# Patient Record
Sex: Female | Born: 1960
Health system: Southern US, Community
[De-identification: ages and names within clinical notes are randomized; demographics above are authoritative.]

## PROBLEM LIST (undated history)

## (undated) DIAGNOSIS — I10 Essential (primary) hypertension: Secondary | ICD-10-CM

---

## 2008-05-27 ENCOUNTER — Ambulatory Visit: Payer: Self-pay | Admitting: Diagnostic Radiology

## 2008-05-28 ENCOUNTER — Inpatient Hospital Stay (HOSPITAL_COMMUNITY): Admission: EM | Admit: 2008-05-28 | Discharge: 2008-05-30 | Payer: Self-pay | Admitting: Emergency Medicine

## 2008-06-18 ENCOUNTER — Inpatient Hospital Stay (HOSPITAL_COMMUNITY): Admission: EM | Admit: 2008-06-18 | Discharge: 2008-06-21 | Payer: Self-pay | Admitting: Emergency Medicine

## 2010-03-31 ENCOUNTER — Encounter: Admission: RE | Admit: 2010-03-31 | Discharge: 2010-03-31 | Payer: Self-pay | Admitting: Orthopedic Surgery

## 2010-09-07 LAB — CBC
HCT: 31.2 % — ABNORMAL LOW (ref 36.0–46.0)
HCT: 41.7 % (ref 36.0–46.0)
HCT: 37.4 % (ref 36.0–46.0)
Hemoglobin: 10.1 g/dL — ABNORMAL LOW (ref 12.0–15.0)
Hemoglobin: 10.9 g/dL — ABNORMAL LOW (ref 12.0–15.0)
Hemoglobin: 13.3 g/dL (ref 12.0–15.0)
Hemoglobin: 9.9 g/dL — ABNORMAL LOW (ref 12.0–15.0)
Hemoglobin: 11.9 g/dL — ABNORMAL LOW (ref 12.0–15.0)
Hemoglobin: 13 g/dL (ref 12.0–15.0)
MCHC: 31.2 g/dL (ref 30.0–36.0)
MCHC: 31.8 g/dL (ref 30.0–36.0)
MCHC: 32.7 g/dL (ref 30.0–36.0)
MCHC: 31.8 g/dL (ref 30.0–36.0)
MCHC: 31.8 g/dL (ref 30.0–36.0)
MCV: 68.1 fL — ABNORMAL LOW (ref 78.0–100.0)
MCV: 68.6 fL — ABNORMAL LOW (ref 78.0–100.0)
MCV: 69.2 fL — ABNORMAL LOW (ref 78.0–100.0)
MCV: 69.6 fL — ABNORMAL LOW (ref 78.0–100.0)
MCV: 69.7 fL — ABNORMAL LOW (ref 78.0–100.0)
RBC: 4.57 MIL/uL (ref 3.87–5.11)
RBC: 5.05 MIL/uL (ref 3.87–5.11)
RBC: 6.07 MIL/uL — ABNORMAL HIGH (ref 3.87–5.11)
RBC: 5.38 MIL/uL — ABNORMAL HIGH (ref 3.87–5.11)
RBC: 5.88 MIL/uL — ABNORMAL HIGH (ref 3.87–5.11)
RDW: 17.7 % — ABNORMAL HIGH (ref 11.5–15.5)
RDW: 18.9 % — ABNORMAL HIGH (ref 11.5–15.5)
WBC: 10.4 10*3/uL (ref 4.0–10.5)
WBC: 14.8 10*3/uL — ABNORMAL HIGH (ref 4.0–10.5)

## 2010-09-07 LAB — COMPREHENSIVE METABOLIC PANEL
ALT: 10 U/L (ref 0–35)
ALT: 10 U/L (ref 0–35)
AST: 15 U/L (ref 0–37)
Alkaline Phosphatase: 108 U/L (ref 39–117)
CO2: 29 mEq/L (ref 19–32)
CO2: 24 mEq/L (ref 19–32)
Calcium: 9.1 mg/dL (ref 8.4–10.5)
Chloride: 105 mEq/L (ref 96–112)
Creatinine, Ser: 0.86 mg/dL (ref 0.4–1.2)
GFR calc Af Amer: >60 mL/min (ref >60)
GFR calc non Af Amer: >60 mL/min (ref >60)
GFR calc non Af Amer: >60 mL/min (ref >60)
Glucose, Bld: 140 mg/dL — ABNORMAL HIGH (ref 70–99)
Glucose, Bld: 126 mg/dL — ABNORMAL HIGH (ref 70–99)
Potassium: 3.7 mEq/L (ref 3.5–5.1)
Sodium: 143 mEq/L (ref 135–145)
Total Bilirubin: 0.8 mg/dL (ref 0.3–1.2)

## 2010-09-07 LAB — BASIC METABOLIC PANEL
BUN: 5 mg/dL — ABNORMAL LOW (ref 6–23)
BUN: 5 mg/dL — ABNORMAL LOW (ref 6–23)
BUN: 5 mg/dL — ABNORMAL LOW (ref 6–23)
CO2: 28 mEq/L (ref 19–32)
CO2: 30 mEq/L (ref 19–32)
CO2: 27 mEq/L (ref 19–32)
Calcium: 8.7 mg/dL (ref 8.4–10.5)
Calcium: 9.2 mg/dL (ref 8.4–10.5)
Calcium: 9.3 mg/dL (ref 8.4–10.5)
Chloride: 102 mEq/L (ref 96–112)
Chloride: 103 mEq/L (ref 96–112)
Creatinine, Ser: 0.72 mg/dL (ref 0.4–1.2)
Creatinine, Ser: 0.69 mg/dL (ref 0.4–1.2)
GFR calc Af Amer: >60 mL/min (ref >60)
GFR calc Af Amer: >60 mL/min (ref >60)
GFR calc Af Amer: >60 mL/min (ref >60)
GFR calc non Af Amer: >60 mL/min (ref >60)
GFR calc non Af Amer: >60 mL/min (ref >60)
Glucose, Bld: 107 mg/dL — ABNORMAL HIGH (ref 70–99)
Glucose, Bld: 119 mg/dL — ABNORMAL HIGH (ref 70–99)
Glucose, Bld: 129 mg/dL — ABNORMAL HIGH (ref 70–99)
Glucose, Bld: 148 mg/dL — ABNORMAL HIGH (ref 70–99)
Potassium: 3 mEq/L — ABNORMAL LOW (ref 3.5–5.1)
Potassium: 3.7 mEq/L (ref 3.5–5.1)
Sodium: 144 mEq/L (ref 135–145)

## 2010-09-07 LAB — CK TOTAL AND CKMB (NOT AT ARMC)
CK, MB: 0.5 ng/mL (ref 0.3–4.0)
CK, MB: 0.9 ng/mL (ref 0.3–4.0)
Relative Index: INVALID (ref 0.0–2.5)
Total CK: 23 U/L (ref 7–177)
Total CK: 13 U/L (ref 7–177)

## 2010-09-07 LAB — HEPARIN LEVEL (UNFRACTIONATED)
Heparin Unfractionated: 0.2 IU/mL — ABNORMAL LOW (ref 0.30–0.70)
Heparin Unfractionated: 0.5 IU/mL (ref 0.30–0.70)

## 2010-09-07 LAB — LIPID PANEL
Cholesterol: 120 mg/dL (ref 0–200)
HDL: 21 mg/dL — ABNORMAL LOW (ref >39)
LDL Cholesterol: 46 mg/dL (ref 0–99)
Triglycerides: 267 mg/dL — ABNORMAL HIGH (ref <150)
VLDL: 63 mg/dL — ABNORMAL HIGH (ref 0–40)

## 2010-09-07 LAB — ALPHA-1 ANTITRYPSIN PHENOTYPE

## 2010-09-07 LAB — SEDIMENTATION RATE
Sed Rate: 18 mm/hr (ref 0–22)
Sed Rate: 34 mm/hr — ABNORMAL HIGH (ref 0–22)

## 2010-09-07 LAB — PROTIME-INR: Prothrombin Time: 14.2 seconds (ref 11.6–15.2)

## 2010-09-07 LAB — DIFFERENTIAL
Basophils Absolute: 0 10*3/uL (ref 0.0–0.1)
Lymphs Abs: 1.5 10*3/uL (ref 0.7–4.0)
Monocytes Absolute: 1 10*3/uL (ref 0.1–1.0)
Monocytes Relative: 7 % (ref 3–12)
Neutrophils Relative %: 83 % — ABNORMAL HIGH (ref 43–77)

## 2010-09-07 LAB — POCT I-STAT, CHEM 8
Creatinine, Ser: 0.9 mg/dL (ref 0.4–1.2)
Glucose, Bld: 141 mg/dL — ABNORMAL HIGH (ref 70–99)
Hemoglobin: 14.6 g/dL (ref 12.0–15.0)
Sodium: 142 mEq/L (ref 135–145)
TCO2: 25 mmol/L (ref 0–100)

## 2010-09-07 LAB — IGG, IGA, IGM
IgA: 193 mg/dL (ref 68–378)
IgM, Serum: 119 mg/dL (ref 60–263)

## 2010-09-07 LAB — C-REACTIVE PROTEIN: CRP: 2.4 mg/dL — ABNORMAL HIGH (ref <0.6)

## 2010-09-07 LAB — POCT CARDIAC MARKERS
CKMB, poc: <1 ng/mL — ABNORMAL LOW (ref 1.0–8.0)
Myoglobin, poc: 52.6 ng/mL (ref 12–200)
Myoglobin, poc: 48.7 ng/mL (ref 12–200)
Troponin i, poc: <0.05 ng/mL (ref 0.00–0.09)

## 2010-09-07 LAB — ANTISTREPTOLYSIN O TITER: ASO: 55 IU/mL (ref 0–116)

## 2010-09-07 LAB — TROPONIN I
Troponin I: 0.01 ng/mL (ref 0.00–0.06)
Troponin I: 0.02 ng/mL (ref 0.00–0.06)
Troponin I: <0.01 ng/mL (ref 0.00–0.06)

## 2010-09-11 ENCOUNTER — Emergency Department (HOSPITAL_BASED_OUTPATIENT_CLINIC_OR_DEPARTMENT_OTHER)
Admission: EM | Admit: 2010-09-11 | Discharge: 2010-09-11 | Disposition: A | Discharge status: Home / Self Care | Payer: Medicaid Other | Attending: Emergency Medicine | Admitting: Emergency Medicine

## 2010-09-11 ENCOUNTER — Emergency Department (INDEPENDENT_AMBULATORY_CARE_PROVIDER_SITE_OTHER): Payer: Medicaid Other

## 2010-09-11 DIAGNOSIS — I2510 Atherosclerotic heart disease of native coronary artery without angina pectoris: Secondary | ICD-10-CM | POA: Insufficient documentation

## 2010-09-11 DIAGNOSIS — I723 Aneurysm of iliac artery: Secondary | ICD-10-CM

## 2010-09-11 DIAGNOSIS — K838 Other specified diseases of biliary tract: Secondary | ICD-10-CM

## 2010-09-11 DIAGNOSIS — K8020 Calculus of gallbladder without cholecystitis without obstruction: Secondary | ICD-10-CM

## 2010-09-11 DIAGNOSIS — I10 Essential (primary) hypertension: Secondary | ICD-10-CM | POA: Insufficient documentation

## 2010-09-11 DIAGNOSIS — F17200 Nicotine dependence, unspecified, uncomplicated: Secondary | ICD-10-CM | POA: Insufficient documentation

## 2010-09-11 DIAGNOSIS — R1013 Epigastric pain: Secondary | ICD-10-CM

## 2010-09-11 DIAGNOSIS — Z79899 Other long term (current) drug therapy: Secondary | ICD-10-CM | POA: Insufficient documentation

## 2010-09-11 DIAGNOSIS — E7800 Pure hypercholesterolemia, unspecified: Secondary | ICD-10-CM | POA: Insufficient documentation

## 2010-09-11 LAB — COMPREHENSIVE METABOLIC PANEL
AST: 16 U/L (ref 0–37)
Albumin: 4.3 g/dL (ref 3.5–5.2)
BUN: 12 mg/dL (ref 6–23)
CO2: 25 mEq/L (ref 19–32)
Calcium: 9.7 mg/dL (ref 8.4–10.5)
Creatinine, Ser: 0.8 mg/dL (ref 0.4–1.2)
GFR calc Af Amer: >60 mL/min (ref >60)
GFR calc non Af Amer: >60 mL/min (ref >60)
Total Bilirubin: 0.9 mg/dL (ref 0.3–1.2)

## 2010-09-11 LAB — URINALYSIS, ROUTINE W REFLEX MICROSCOPIC
Bilirubin Urine: NEGATIVE
Leukocytes, UA: NEGATIVE
Nitrite: NEGATIVE
Specific Gravity, Urine: 1.026 (ref 1.005–1.030)
Urobilinogen, UA: 0.2 mg/dL (ref 0.0–1.0)
pH: 5.5 (ref 5.0–8.0)

## 2010-09-11 LAB — URINE MICROSCOPIC-ADD ON

## 2010-09-11 LAB — CBC
MCH: 29 pg (ref 26.0–34.0)
MCHC: 34.6 g/dL (ref 30.0–36.0)
MCV: 83.6 fL (ref 78.0–100.0)
Platelets: 235 10*3/uL (ref 150–400)

## 2010-09-11 LAB — DIFFERENTIAL
Basophils Relative: 0 % (ref 0–1)
Eosinophils Absolute: 0.1 10*3/uL (ref 0.0–0.7)
Lymphs Abs: 1.3 10*3/uL (ref 0.7–4.0)
Monocytes Absolute: 0.6 10*3/uL (ref 0.1–1.0)
Monocytes Relative: 6 % (ref 3–12)

## 2010-10-06 NOTE — Discharge Summary (Signed)
NAMELATORRIA, ZEOLI                  ACCOUNT NO.:  [id number]   MEDICAL RECORD NO.:  [id number]          PATIENT TYPE:  INP   LOCATION:  3731                         FACILITY:  [facility]   PHYSICIAN:  Mohan N. Sharyn Lull, M.D. DATE OF BIRTH:  1960-12-26   DATE OF ADMISSION:  06/18/2008  DATE OF DISCHARGE:  06/21/2008                               DISCHARGE SUMMARY   ADMITTING DIAGNOSES:  1. Chest pain, rule out myocardial infarction.  2. Hypertensive urgency.  3. Glucose intolerance.  4. Hypercholesterolemia.  5. History of seizure disorder.  6. History of cerebral aneurysm.  7. 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   DISCHARGE DIAGNOSES:  1. Status post chest pain, myocardial infarction ruled out.  2. Status post left catheterization, stable angina.  3. Coronary artery disease with multiple aneurysms.  4. Mild thoracic and abdominal aneurysm.  5. Hypertension.  6. Hypercholesterolemia.  7. Glucose intolerance.  8. 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  9. History of cerebral aneurysm in the past status post resection of      cerebral aneurysm and ventriculoperitoneal shunt in the past at      [facility].   DISCHARGE HOME MEDICATIONS:  1. Toprol-XL 150 mg twice daily.  2. Lisinopril 20 mg 1 tablet twice daily.  3. Norvasc 10 mg 1 tablet daily.  4. Benicar 20 mg 1 tablet daily.  5. Enteric-coated aspirin 81 mg 1 tablet daily.  6. Plavix 75 mg 1 tablet daily with food.  7. Pepcid 20 mg 1 tablet twice daily.  8. Nitrostat sublingual 0.4 mg as tolerated.  9. Feosol 325 mg 1 tablet daily.   DIET:  Low salt, low cholesterol.  The patient has been advised to avoid  sweets.  Follow up with me in 1 week.  Follow up at [facility] for  further evaluation and treatment for possible Takayasu arteritis.   CONDITION AT DISCHARGE:  Stable.   The patient has been advised to monitor blood pressure daily and chart.  Follow up  with me in 1 week and her private medical doctor as scheduled.   BRIEF HISTORY AND HOSPITAL COURSE:  Ms. Lisbon is a 50 year old white  female with past medical history significant for hypertension, history  of cerebral aneurysm status post resection of the aneurysm and VP shunt  at [facility], questionable history of Takayasu arteritis,  hypercholesterolemia, history of tobacco abuse, glucose intolerance, and  history of seizure disorder.  She came to the ER complaining of  retrosternal chest pain, initially sharp/pressure, radiating to the neck  associated with diaphoresis, grade 9/10.  She took 1 sublingual nitro  without relief and then started on IV nitro with relief of chest pain in  the ER from 9/10 to 3-4/10.  Denies history of exertional chest pain.  The patient was noted to have blood pressure of 200/130.  Denies any  weakness in the arms or legs.  Denies any headaches.  Denies  palpitations, lightheadedness, or syncope.  Denies PND, orthopnea, or  leg swelling.  The patient was  recently discharged from the hospital and  had Persantine Myoview, which was negative for ischemia with EF of 49%.   PAST MEDICAL HISTORY:  As above.   PAST SURGICAL HISTORY:  She had cerebral aneurysm repair and VP shunt in  the past and C-section x3 in the past.   SOCIAL HISTORY:  She is single, has 3 children.  Smoked half pack per  day for 25 years.  No history of alcohol abuse.  She did some paint work  in the past.   FAMILY HISTORY:  Father died of cerebral hemorrhage at the age of 38.  Mother is alive, she is hypertensive.  One sister is hypertensive.  One  brother has cerebral aneurysm.   ALLERGIES:  No known drug allergies.   MEDICATIONS AT HOME:  1. She was on enteric-coated aspirin 81 mg p.o. daily.  2. Toprol 100 p.o. b.i.d.  3. Lisinopril 20 mg p.o. b.i.d.  4. Norvasc 5 mg p.o. b.i.d.  5. Simvastatin 40 mg p.o. at bedtime.  6. Nitrostat 0.4 mg sublingual p.r.n.  7. Feosol 325 mg  p.o. daily.   PHYSICAL EXAMINATION:  GENERAL:  She was alert, awake, and oriented x3,  in no acute distress.  VITAL SIGNS:  Blood pressure was 200/130, repeat pressure was 168/113,  and pulse was 78, regular.  HEENT:  Conjunctivae was pink.  NECK:  Supple.  No JVD.  No bruit.  There was mild carotid tenderness.  LUNGS:  Clear to auscultation without rhonchi or rales.  CARDIOVASCULAR:  S1 and S2 was normal.  There was soft systolic murmur  and has no gallop.  ABDOMEN:  Soft.  Bowel sounds are present, nontender.  EXTREMITIES:  There is no clubbing, cyanosis, or edema.   LABORATORY DATA:  Her hemoglobin was 14.6 and hematocrit 43.  Cardiac  enzymes, point-of-care:  CPK-MB was less than 1.0, troponin I was less  than 0.05.  Second set of CK was same, CK-MB less than 1, and troponin I  less than 0.05.  Hemoglobin by lab was hemoglobin was 13, hematocrit  40.9, and white count of 12.1.  Sodium was 143, potassium 3.7, glucose  126, BUN 3, and creatinine 0.80.  CK total was 29 and MB 0.6.  Second  set of CK was 13, MB 0.9, and troponin I was 0.02 and less than 0.01.  Her hemoglobin A1c done on January 5th was 6.1.  On January 6,  cholesterol was 120, triglycerides 267, HDL was low at 21, and LDL 46.  Chest x-ray showed minimal subsegmental atelectasis at the left base.  The patient had CT angio which was done on January 4, which showed no  evidence of thoracic aortic dissection, wall thickening involving the  ascending aorta, proximal left common carotid artery, and most notably  main pulmonary trunk and both right and left main pulmonary arteries.  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  CT of the abdomen  showed small infrarenal abdominal aortic aneurysm.  No evidence of  aortic dissection.  Wall thickening involving descending  portion of the  SMA and several of its branches again consistent with arteritis.  Bilateral renal arteries are patent.   BRIEF HOSPITAL COURSE:  The patient was admitted to telemetry unit.  MI  was ruled out by serial enzymes and EKG.  EKG showed normal sinus rhythm  with  LVH with strain pattern versus ischemia.  The patient had recent  Persantine Myoview which showed no evidence of ischemia, but due to  recurrent chest pain, the patient subsequently underwent left cardiac  catheterization and selective left and right coronary angiography and  aortography as per procedure report.  The patient tolerated the  procedure well.  There are no complications.  The patient had not had  any further episodes of chest pain during the hospital stay.  The  patient has been ambulating in hallway without any problems.  Her groin  is stable with no evidence of hematoma or bruit.  The patient will be  discharged home on above medications and will be followed up closely at  [facility] for further management of possible arteritis.  The patient  will be also followed up in my office in 1 week and with her primary  doctor as scheduled.      Eduardo Osier. Sharyn Lull, M.D.  Electronically Signed     [facility]/MEDQ  D:  06/21/2008  T:  06/22/2008  Job:  [id number]

## 2010-10-06 NOTE — Discharge Summary (Signed)
Cassandra Fernandez, Cassandra Fernandez                  ACCOUNT NO.:  [id number]   MEDICAL RECORD NO.:  [id number]          PATIENT TYPE:  INP   LOCATION:  4729                         FACILITY:  [facility]   PHYSICIAN:  Eduardo Osier. Sharyn Lull, M.D. DATE OF BIRTH:  02-Apr-1961   DATE OF ADMISSION:  05/28/2008  DATE OF DISCHARGE:  05/30/2008                               DISCHARGE SUMMARY   ADMITTING DIAGNOSES:  1. Chest pain rule out myocardial infarction.  2. Uncontrolled hypertension.  3. Hypercholesteremia.  4. History of cerebral aneurysm, status post ventriculoperitoneal      shunt in the past at [facility].  5. Tobacco abuse.  6. 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  7. Elevated blood sugar rule out diabetes mellitus.   DISCHARGE DIAGNOSES:  1. Status post chest pain, myocardial infarction ruled out, negative      Persantine Myoview.  2. Rule out gastroesophageal reflux disease.  3. Rule out gastritis.  4. Rule out peptic ulcer disease.  5. Hypertension.  6. Hypercholesteremia.  7. Probably Takayasu arteritis.  8. History of cerebral aneurysm, status post resection of aneurysm and      ventriculoperitoneal shunt in the past at [facility].  9. Tobacco abuse.  10.Glucose intolerance.  11.Hypercholesteremia.   DISCHARGED HOME MEDICATIONS:  1. Enteric-coated aspirin 81 mg 1 tablet daily.  2. Toprol-XL 100 mg 1 tablet twice daily.  3. Lisinopril 20 mg 1 tablet twice daily.  4. Norvasc 5 mg 1 tablet daily.  5. Simvastatin 40 mg 1 tablet daily at night.  6. Nitrostat 0.4 mg sublingual use as directed.  7. Feosol 1 tablet daily.   DIET:  Low salt, low cholesterol.  The patient has been advised to avoid  sweets.  The patient has been advised to monitor blood pressure daily.  Increase activity slowly as tolerated.  Follow up with me in 1 week and  at [facility] as scheduled.   Condition on discharge is stable.   BRIEF HISTORY AND HOSPITAL COURSE:  Cassandra Fernandez is a 50 year old white  female with past  medical history significant for hypertension, history  of cerebral aneurysm, questionable history of Takayasu arteritis, [facility].  The patient complained of  retrosternal chest pain radiating to the neck, states chest pain was  pressured, sharp, relieved with aspirin and sublingual nitro and  morphine sulfate.  The patient denies any shortness of breath.  Denies  palpitation, lightheadedness, or syncope.  Denies chest pain at present.  Denies any cough, fever, or chills.  Denies relation of chest pain to  food, breathing, but occasionally chest pain increases with deep  breathing.   PAST MEDICAL HISTORY:  As above also history of seizure disorders.   PAST SURGICAL HISTORY:  She had repair of cerebral aneurysm and  subsequently had VP shunt and C-section x3 in the past.   SOCIAL HISTORY:  She is single, three children.  Smoked half pack per  day for 25 years.  No history of alcohol abuse.  Used to do paintwork in  the past.   FAMILY HISTORY:  Father  died of cerebral hemorrhage at the age of 24.  Mother is alive.  She is hypertensive.  One brother has cerebral  aneurysm.  One sister is hypertensive.   ALLERGIES:  No known drug allergies.   MEDICATIONS:  She was on atenolol and lisinopril.  She ran out of the  medication 1-1/2 months ago.   PHYSICAL EXAMINATION:  GENERAL:  She is alert, awake, and oriented x3 in  no acute distress.  VITAL SIGNS:  Blood pressure was 148/96, pulse was 80 and regular.  HEENT:  Conjunctiva was pink.  NECK:  Supple, no JVD, no bruit.  LUNGS:  Clear to auscultation without rhonchi or rales.  CARDIOVASCULAR:  S1, S2 was normal.  There was soft systolic and S4  systolic murmur and S4 gallop.  ABDOMEN:  Soft, bowel sounds were present, nontender.  EXTREMITIES:  There was no clubbing, cyanosis, or edema.   LABORATORIES:  Her hemoglobin was 10.9, hematocrit 35, white count of  14.8.  Glucose was 140, BUN 4,  creatinine 0.86, potassium was 3.9.  Cardiac enzymes; two sets were normal.  Her cholesterol was 148,  triglycerides 317, LDL 53, HDL was 22, hemoglobin A1c was 6.1.  Fibrinogen level was 468, which was in high normal range.  C-reactive  protein was elevated to 0.4.  IgG, IgA, and IgM immunoglobulin were in  normal range.  Rheumatoid factor was less than 20.  ASO titer was 55,  which was in normal range.  Sed rate was in normal range, 18.  ANA is  still pending.  Persantine Myoview scan showed LVH, no evidence of  inducible ischemia, EF of 49%.  Chest x-ray showed mild cardiomegaly, no  acute chest findings.  The patient had CT angio of the chest and  abdomen, which showed no evidence of thoracic, aortic dissection that  evolved thickening involving the ascending thoracic aorta, the proximal  left common carotid artery, and most notably the main pulmonary trunk,  and both right and left main pulmonary arteries.  The right pulmonary  artery is mildly narrowed over a long segment.  Left coronary artery  aneurysm.  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  There was 8-mm right thyroid nodule.  All  these findings were consistent with arteritis likely Takayasu arteritis  given the pulmonary artery involvement.  CT of the abdomen showed small  infrarenal abdominal aortic aneurysm measuring 3.4 x 3.2 cm maximally,  no evidence of aortic dissection, wall thickening involving the  descending portion of the SMA and several its branches again consistent  with arteritis.  Two renal arteries bilaterally were patent, patent  iliac access SMA and IMA, cholelithiasis without CT evidence of acute  cholecystitis.  CT of the pelvis showed bilateral common iliac artery  aneurysm, bilateral internal iliac artery aneurysm, left common femoral  artery aneurysm.  Findings again consistent with arteritis.   There was  5.5-cm left ovarian cyst.   BRIEF HOSPITAL COURSE:  The patient was admitted to telemetry unit.  MI  was ruled out by serial enzymes and EKG.  The patient did not have any  chest pain during the hospital stay.  The patient subsequently underwent  Persantine Myoview, which showed no evidence of ischemia.  The patient  has been ambulating in hallway without any problems.  The patient is  being followed at [facility] for possible Takayasu arteritis.  The  patient will be discharged home  on above medications and will be  followed up in my office in 1 week and at [facility] as scheduled.      Eduardo Osier. Sharyn Lull, M.D.  Electronically Signed     M[facility]/MEDQ  D:  05/30/2008  T:  05/30/2008  Job:  [id number]

## 2020-07-10 ENCOUNTER — Encounter (HOSPITAL_BASED_OUTPATIENT_CLINIC_OR_DEPARTMENT_OTHER): Payer: Self-pay | Admitting: *Deleted

## 2020-07-10 ENCOUNTER — Emergency Department (HOSPITAL_BASED_OUTPATIENT_CLINIC_OR_DEPARTMENT_OTHER): Payer: Self-pay

## 2020-07-10 ENCOUNTER — Other Ambulatory Visit: Payer: Self-pay

## 2020-07-10 ENCOUNTER — Inpatient Hospital Stay (HOSPITAL_BASED_OUTPATIENT_CLINIC_OR_DEPARTMENT_OTHER)
Admission: EM | Admit: 2020-07-10 | Discharge: 2020-07-12 | DRG: 309 | Disposition: A | Discharge status: Home / Self Care | Payer: No Typology Code available for payment source | Attending: Internal Medicine | Admitting: Internal Medicine

## 2020-07-10 DIAGNOSIS — I110 Hypertensive heart disease with heart failure: Secondary | ICD-10-CM | POA: Diagnosis present

## 2020-07-10 DIAGNOSIS — K219 Gastro-esophageal reflux disease without esophagitis: Secondary | ICD-10-CM | POA: Diagnosis present

## 2020-07-10 DIAGNOSIS — I6529 Occlusion and stenosis of unspecified carotid artery: Secondary | ICD-10-CM | POA: Diagnosis present

## 2020-07-10 DIAGNOSIS — I479 Paroxysmal tachycardia, unspecified: Secondary | ICD-10-CM

## 2020-07-10 DIAGNOSIS — Z9049 Acquired absence of other specified parts of digestive tract: Secondary | ICD-10-CM

## 2020-07-10 DIAGNOSIS — Z982 Presence of cerebrospinal fluid drainage device: Secondary | ICD-10-CM

## 2020-07-10 DIAGNOSIS — I719 Aortic aneurysm of unspecified site, without rupture: Secondary | ICD-10-CM | POA: Diagnosis present

## 2020-07-10 DIAGNOSIS — Z79899 Other long term (current) drug therapy: Secondary | ICD-10-CM

## 2020-07-10 DIAGNOSIS — Z20822 Contact with and (suspected) exposure to covid-19: Secondary | ICD-10-CM | POA: Diagnosis present

## 2020-07-10 DIAGNOSIS — E876 Hypokalemia: Secondary | ICD-10-CM

## 2020-07-10 DIAGNOSIS — Z951 Presence of aortocoronary bypass graft: Secondary | ICD-10-CM

## 2020-07-10 DIAGNOSIS — F17210 Nicotine dependence, cigarettes, uncomplicated: Secondary | ICD-10-CM | POA: Diagnosis present

## 2020-07-10 DIAGNOSIS — Z952 Presence of prosthetic heart valve: Secondary | ICD-10-CM

## 2020-07-10 DIAGNOSIS — R55 Syncope and collapse: Secondary | ICD-10-CM | POA: Diagnosis not present

## 2020-07-10 DIAGNOSIS — I4891 Unspecified atrial fibrillation: Principal | ICD-10-CM | POA: Diagnosis present

## 2020-07-10 DIAGNOSIS — N179 Acute kidney failure, unspecified: Secondary | ICD-10-CM | POA: Diagnosis not present

## 2020-07-10 DIAGNOSIS — E785 Hyperlipidemia, unspecified: Secondary | ICD-10-CM | POA: Diagnosis present

## 2020-07-10 DIAGNOSIS — W19XXXA Unspecified fall, initial encounter: Secondary | ICD-10-CM | POA: Diagnosis present

## 2020-07-10 DIAGNOSIS — R072 Precordial pain: Secondary | ICD-10-CM

## 2020-07-10 DIAGNOSIS — R270 Ataxia, unspecified: Secondary | ICD-10-CM

## 2020-07-10 DIAGNOSIS — Z953 Presence of xenogenic heart valve: Secondary | ICD-10-CM

## 2020-07-10 DIAGNOSIS — I5032 Chronic diastolic (congestive) heart failure: Secondary | ICD-10-CM | POA: Diagnosis present

## 2020-07-10 DIAGNOSIS — I248 Other forms of acute ischemic heart disease: Secondary | ICD-10-CM | POA: Diagnosis present

## 2020-07-10 DIAGNOSIS — Z7902 Long term (current) use of antithrombotics/antiplatelets: Secondary | ICD-10-CM

## 2020-07-10 DIAGNOSIS — Z885 Allergy status to narcotic agent status: Secondary | ICD-10-CM

## 2020-07-10 DIAGNOSIS — I25119 Atherosclerotic heart disease of native coronary artery with unspecified angina pectoris: Secondary | ICD-10-CM | POA: Diagnosis present

## 2020-07-10 DIAGNOSIS — I739 Peripheral vascular disease, unspecified: Secondary | ICD-10-CM | POA: Diagnosis present

## 2020-07-10 DIAGNOSIS — Z8249 Family history of ischemic heart disease and other diseases of the circulatory system: Secondary | ICD-10-CM

## 2020-07-10 DIAGNOSIS — I951 Orthostatic hypotension: Secondary | ICD-10-CM | POA: Diagnosis present

## 2020-07-10 DIAGNOSIS — I2510 Atherosclerotic heart disease of native coronary artery without angina pectoris: Secondary | ICD-10-CM | POA: Diagnosis present

## 2020-07-10 DIAGNOSIS — R778 Other specified abnormalities of plasma proteins: Secondary | ICD-10-CM | POA: Diagnosis present

## 2020-07-10 DIAGNOSIS — Z8679 Personal history of other diseases of the circulatory system: Secondary | ICD-10-CM

## 2020-07-10 DIAGNOSIS — I671 Cerebral aneurysm, nonruptured: Secondary | ICD-10-CM | POA: Diagnosis present

## 2020-07-10 DIAGNOSIS — R079 Chest pain, unspecified: Secondary | ICD-10-CM

## 2020-07-10 DIAGNOSIS — I10 Essential (primary) hypertension: Secondary | ICD-10-CM | POA: Diagnosis present

## 2020-07-10 HISTORY — DX: Essential (primary) hypertension: I10

## 2020-07-10 LAB — APTT: aPTT: 27 seconds (ref 24–36)

## 2020-07-10 LAB — CBC WITH DIFFERENTIAL/PLATELET
Abs Immature Granulocytes: 0.02 10*3/uL (ref 0.00–0.07)
Basophils Absolute: 0.1 10*3/uL (ref 0.0–0.1)
Basophils Relative: 1 %
Eosinophils Absolute: 0 10*3/uL (ref 0.0–0.5)
Eosinophils Relative: 0 %
HCT: 43.4 % (ref 36.0–46.0)
Hemoglobin: 14.7 g/dL (ref 12.0–15.0)
Immature Granulocytes: 0 %
Lymphocytes Relative: 12 %
Lymphs Abs: 0.9 10*3/uL (ref 0.7–4.0)
MCH: 29.2 pg (ref 26.0–34.0)
MCHC: 33.9 g/dL (ref 30.0–36.0)
MCV: 86.1 fL (ref 80.0–100.0)
Monocytes Absolute: 0.3 10*3/uL (ref 0.1–1.0)
Monocytes Relative: 4 %
Neutro Abs: 6.5 10*3/uL (ref 1.7–7.7)
Neutrophils Relative %: 83 %
Platelets: 194 10*3/uL (ref 150–400)
RBC: 5.04 MIL/uL (ref 3.87–5.11)
RDW: 13.9 % (ref 11.5–15.5)
WBC: 7.8 10*3/uL (ref 4.0–10.5)
nRBC: 0 % (ref 0.0–0.2)

## 2020-07-10 LAB — URINALYSIS, ROUTINE W REFLEX MICROSCOPIC
Bilirubin Urine: NEGATIVE
Glucose, UA: NEGATIVE mg/dL
Hgb urine dipstick: NEGATIVE
Ketones, ur: NEGATIVE mg/dL
Leukocytes,Ua: NEGATIVE
Nitrite: NEGATIVE
Protein, ur: NEGATIVE mg/dL
Specific Gravity, Urine: 1.019 (ref 1.005–1.030)
pH: 5 (ref 5.0–8.0)

## 2020-07-10 LAB — BASIC METABOLIC PANEL
Anion gap: 12 (ref 5–15)
BUN: 16 mg/dL (ref 8–23)
CO2: 26 mmol/L (ref 22–32)
Calcium: 9.8 mg/dL (ref 8.9–10.3)
Chloride: 104 mmol/L (ref 98–111)
Creatinine, Ser: 1.45 mg/dL — ABNORMAL HIGH (ref 0.44–1.00)
GFR, Estimated: 41 mL/min — ABNORMAL LOW (ref >60)
Glucose, Bld: 188 mg/dL — ABNORMAL HIGH (ref 70–99)
Potassium: 2.9 mmol/L — ABNORMAL LOW (ref 3.5–5.1)
Sodium: 142 mmol/L (ref 135–145)

## 2020-07-10 LAB — TROPONIN I (HIGH SENSITIVITY)
Troponin I (High Sensitivity): 19 ng/L — ABNORMAL HIGH (ref <18)
Troponin I (High Sensitivity): 38 ng/L — ABNORMAL HIGH (ref <18)
Troponin I (High Sensitivity): 60 ng/L — ABNORMAL HIGH (ref <18)

## 2020-07-10 LAB — PROTIME-INR
INR: 1 (ref 0.8–1.2)
Prothrombin Time: 12.9 seconds (ref 11.4–15.2)

## 2020-07-10 LAB — SARS CORONAVIRUS 2 (TAT 6-24 HRS): SARS Coronavirus 2: NEGATIVE

## 2020-07-10 LAB — MAGNESIUM: Magnesium: 1.9 mg/dL (ref 1.7–2.4)

## 2020-07-10 MED ORDER — POTASSIUM CHLORIDE CRYS ER 20 MEQ PO TBCR
40.0000 meq | EXTENDED_RELEASE_TABLET | Freq: Once | ORAL | Status: AC
  Administered 2020-07-10: 40 meq via ORAL
  Filled 2020-07-10: qty 2

## 2020-07-10 MED ORDER — ASPIRIN 81 MG PO CHEW
324.0000 mg | CHEWABLE_TABLET | Freq: Once | ORAL | Status: AC
  Administered 2020-07-10: 324 mg via ORAL
  Filled 2020-07-10: qty 4

## 2020-07-10 NOTE — Progress Notes (Signed)
Patient is a 61 year old female with history of coronary disease status post CABG, status post aortic valve replacement, abdominal aorta aneurysm status post repair, brain aneurysm, hypertension, hyperlipidemia who presented to med [facility] with complaints of chest pain, syncopal attack.   As per the report,she is a smoker. Patient has extensive cardiac/peripheral vascular disease history and follows with cardiology at [facility]. She woke up this morning feeling well. She reported a brief syncopal episode at home while making coffee. After that, she was complaining of heaviness/pressure/tightness not associated with exertion.  Also reported some nausea and diaphoresis.  She als has  history of GERD and nausea. Patient presented to med [facility].  On presentation she was tachycardiac with heart rate in the range of 140s.  EKG showed sinus tachycardia with PVCs.  Troponin was mildly elevated.  Blood pressure stable.  Lab work showed potassium of 2.9.  Creatinine of 1.45.  I have asked ED physician to order for potassium supplementation.  Magnesium level is normal Most of her medical care is at [facility] and she follows with cardiology over there.  ED physician tried to transfer him to [facility] but did not have bed and they recommended outpatient follow-up. Patient will be admitted for observation for chest pain rule out.  We can consider cardiology consultation when she presents here and after we evaluate her. In the meantime, we can initiate some syncopal work-up with echo/carotid Doppler/orthostatic vitals/PT evaluation after she comes here.

## 2020-07-10 NOTE — H&P (Signed)
Cassandra Fernandez BJY:[id number] DOB: Jul 24, 1960 DOA: 07/10/2020  PCP: Patient, No Pcp Per   Outpatient Specialists:   CARDS: At [facility]    Patient arrived to ER on 07/10/20 at 1021 Referred by Attending Burnadette Pop, MD  Patient coming from: home Lives  With family    Chief Complaint:   Chief Complaint  Patient presents with  . Chest Pain    HPI: Cassandra Fernandez is a 60 y.o. female with medical history significant of CAD sp CABG 2020, sp Aortic valve replacement, abdominal aorta aneurysm status post repair, brain aneurysm, hypertension, hyperlipidemia     Presented with   Chest pain and syncope,  Continues to smoke She had a brief syncopal episode at home while making coffee she turned around and next thing she knee she was sitting on the floor and since then have had heaviness pressure tightness associated in her chest not worse with exertion this lasted about 4 hours She vomited bile No hx of pleuritic pain Associated with some nausea and diaphoresis Presented to med [facility] point was found to be tachycardic up to 140 s With troponin elevated Potassium was down to 2.9 creatinine 1.45 No beds available at [facility]  she is now CP free  She reports when she moves too fast she gets dizzy Ever since her 1st aneurysm was discovered 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   She has been trying to quit smoking still takes a few puffs now and then No etOh Infectious risk factors:  Reports  chest pain Nausea     Has  been vaccinated against COVID and boosted   Initial COVID TEST  NEGATIVE   Lab Results  Component Value Date   SARSCOV2NAA NEGATIVE 07/10/2020     Regarding pertinent Chronic problems:    Hyperlipidemia -  on statins lipitor      HTN on was on Norvasc, lisinopril, lopressor   chronic CHF diastolic  - last echo 2018 grade 2 diastolic CHF lasix   CAD sp CABG 2020 - On  statin,  betablocker, Plavix                 -  followed by cardiology                - last cardiac cath  Hx of CABG--07/04/2018 [facility] to [facility];   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 Onalee Hua, at [facility] They Recommend admit to medicine can see as outpatient    Hospitalist was called for admission for chest pain and syncope  The following Work up has been ordered so far:  Orders Placed This Encounter  Procedures  . Critical Care  . SARS Coronavirus 2 (TAT 6-24 hrs)  . Resp Panel by RT-PCR (Flu A&B, Covid) Nasopharyngeal Swab  . DG Chest Portable 1 View  . CT Head Wo Contrast  . Basic metabolic panel  . CBC with Differential/Platelet  . Magnesium  . Protime-INR  . APTT  . Cardiac monitoring  . Initiate Carrier Fluid Protocol  . Consult to hospitalist  ALL PATIENTS BEING ADMITTED/HAVING PROCEDURES NEED COVID-19 SCREENING  . Pulse oximetry, continuous  . CBG monitoring, ED  . EKG 12-Lead  .  Repeat EKG  . EKG 12-Lead  . Insert peripheral IV  . Insert peripheral IV  . Place in observation (patient's expected length of stay will be less than 2 midnights)    Following Medications were ordered in ER: Medications  aspirin chewable tablet 324 mg (324 mg Oral Given 07/10/20 1200)  potassium chloride SA (KLOR-CON) CR tablet 40 mEq (40 mEq Oral Given 07/10/20 1452)        Consult Orders  (From admission, onward)         Start     Ordered   07/10/20 1348  Consult to hospitalist  ALL PATIENTS BEING ADMITTED/HAVING PROCEDURES NEED COVID-19 SCREENING Called [facility] spoke with Selena Batten  Once       Comments: ALL PATIENTS BEING ADMITTED/HAVING PROCEDURES NEED COVID-19 SCREENING  Provider:  (Not yet assigned)  Question Answer Comment  Place  call to: [facility]   Reason for Consult Admit      07/10/20 1347           Significant initial  Findings: Abnormal Labs Reviewed  BASIC METABOLIC PANEL - Abnormal; Notable for the following components:      Result Value   Potassium 2.9 (*)    Glucose, Bld 188 (*)    Creatinine, Ser 1.45 (*)    GFR, Estimated 41 (*)    All other components within normal limits  TROPONIN I (HIGH SENSITIVITY) - Abnormal; Notable for the following components:   Troponin I (High Sensitivity) 19 (*)    All other components within normal limits  TROPONIN I (HIGH SENSITIVITY) - Abnormal; Notable for the following components:   Troponin I (High Sensitivity) 38 (*)    All other components within normal limits  TROPONIN I (HIGH SENSITIVITY) - Abnormal; Notable for the following components:   Troponin I (High Sensitivity) 60 (*)    All other components within normal limits    Otherwise labs showing:    Recent Labs  Lab 07/10/20 1122  NA 142  K 2.9*  CO2 26  GLUCOSE 188*  BUN 16  CREATININE 1.45*  CALCIUM 9.8  MG 1.9    Cr    Up from baseline see below Lab Results  Component Value Date   CREATININE 1.45 (H) 07/10/2020   CREATININE 0.8 09/11/2010   CREATININE 0.69 06/19/2008    No results for input(s): AST, ALT, ALKPHOS, BILITOT, PROT, ALBUMIN in the last 168 hours. Lab Results  Component Value Date   CALCIUM 9.8 07/10/2020      WBC      Component Value Date/Time   WBC 7.8 07/10/2020 1122   LYMPHSABS 0.9 07/10/2020 1122   MONOABS 0.3 07/10/2020 1122   EOSABS 0.0 07/10/2020 1122   BASOSABS 0.1 07/10/2020 1122    Plt: Lab Results  Component Value Date   PLT 194 07/10/2020    COVID-19 Labs  No results for input(s): DDIMER, FERRITIN, LDH, CRP in the last 72 hours.  Lab Results  Component Value Date   SARSCOV2NAA NEGATIVE 07/10/2020     HG/HCT  stable,      Component Value Date/Time   HGB 14.7 07/10/2020 1122   HCT 43.4 07/10/2020 1122   MCV 86.1 07/10/2020  1122    No results for input(s): LIPASE, AMYLASE in the last 168 hours. No results for input(s): AMMONIA in the last 168 hours.    Troponin 19-38-60-80 Cardiac Panel (last 3 results) No results for input(s): CKTOTAL, CKMB, TROPONINI, RELINDX in the last 72 hours.     ECG:  Ordered Personally reviewed by me showing: HR : 143 Rhythm: Sinus tachycardia  Abnormal ECG inferior TWI are new Anterior ST elevation   QTC 521  Repeat  HR 71 Sinus rhythm Probable LVH  with secondary repol abnrm  QTC Calculation:    441  BNP (last 3 results) Recent Labs    07/10/20 2340  BNP 515.2*        UA   no evidence of UTI      Urine analysis:    Component Value Date/Time   COLORURINE YELLOW 07/10/2020 2304   APPEARANCEUR HAZY (A) 07/10/2020 2304   LABSPEC 1.019 07/10/2020 2304   PHURINE 5.0 07/10/2020 2304   GLUCOSEU NEGATIVE 07/10/2020 2304   HGBUR NEGATIVE 07/10/2020 2304   BILIRUBINUR NEGATIVE 07/10/2020 2304   KETONESUR NEGATIVE 07/10/2020 2304   PROTEINUR NEGATIVE 07/10/2020 2304   UROBILINOGEN 0.2 09/11/2010 1133   NITRITE NEGATIVE 07/10/2020 2304   LEUKOCYTESUR NEGATIVE 07/10/2020 2304       Ordered  CT HEAD possible 6 mm annuresim CXR -  NON acute    ED Triage Vitals  Enc Vitals Group     BP 07/10/20 1036 101/71     Pulse Rate 07/10/20 1036 86     Resp 07/10/20 1036 (!) 22     Temp 07/10/20 1036 97.8 F (36.6 C)     Temp Source 07/10/20 2159 Oral     SpO2 07/10/20 1036 99 %     Weight 07/10/20 1036 158 lb 8.2 oz (71.9 kg)     Height 07/10/20 1030  (1.676 m)     Head Circumference --      Peak Flow --      Pain Score 07/10/20 1029 7     Pain Loc --      Pain Edu? --      Excl. in GC? --   TMAX(24)@       Latest  Blood pressure 118/72, pulse 82, temperature 98.2 F (36.8 C), temperature source Oral, resp. rate 18, height  (1.676 m), weight 71.8 kg, SpO2 99 %.   Review of Systems:    Pertinent positives include:    Fatigue  abdominal pain,  nausea, vomiting, heartburn  chest pain,  Constitutional:  No weight loss, night sweats, Fevers, chills, , weight loss  HEENT:  No headaches, Difficulty swallowing,Tooth/dental problems,Sore throat,  No sneezing, itching, ear ache, nasal congestion, post nasal drip,  Cardio-vascular:  NoOrthopnea, PND, anasarca, dizziness, palpitations.no Bilateral lower extremity swelling  GI:  No, indigestion, diarrhea, change in bowel habits, loss of appetite, melena, blood in stool, hematemesis Resp:  no shortness of breath at rest. No dyspnea on exertion, No excess mucus, no productive cough, No non-productive cough, No coughing up of blood.No change in color of mucus.No wheezing. Skin:  no rash or lesions. No jaundice GU:  no dysuria, change in color of urine, no urgency or frequency. No straining to urinate.  No flank pain.  Musculoskeletal:  No joint pain or no joint swelling. No decreased range of motion. No back pain.  Psych:  No change in mood or affect. No depression or anxiety. No memory loss.  Neuro: no localizing neurological complaints, no tingling, no weakness, no double vision, no gait abnormality, no slurred speech, no confusion  All systems reviewed and apart from HOPI all are negative  Past Medical History:   Past Medical History:  Diagnosis Date  . Hypertension       Past Surgical History:  Procedure Laterality Date  .  CEREBRAL ANEURYSM REPAIR    . CESAREAN SECTION    . CHOLECYSTECTOMY    . open heart surgery      Social History:  Ambulatory    independently      reports that she has been smoking. She has never used smokeless tobacco. She reports current drug use. Drug: Marijuana. She reports that she does not drink alcohol.     Family History:  Family History  Problem Relation Age of Onset  . Hypertension Other     Allergies: Allergies  Allergen Reactions  . Morphine And Related Itching     Prior to Admission medications   Medication Sig Start  Date End Date Taking? Authorizing Provider  amLODipine (NORVASC) 10 MG tablet Take by mouth. 12/10/19  Yes [provider]  atorvastatin (LIPITOR) 80 MG tablet Take by mouth. 12/10/19 12/09/20 Yes [provider]  clopidogrel (PLAVIX) 75 MG tablet Take 1 tablet by mouth daily. 12/10/19  Yes [provider]  furosemide (LASIX) 40 MG tablet Take by mouth. 12/10/19 12/09/20 Yes [provider]  lisinopril (ZESTRIL) 5 MG tablet Take by mouth. 12/10/19  Yes [provider]  metoprolol tartrate (LOPRESSOR) 50 MG tablet Take 1 tablet by mouth 2 (two) times daily. 12/10/19  Yes [provider]  gemfibrozil (LOPID) 600 MG tablet Take by mouth.    [provider]   Physical Exam: Vitals with BMI 07/10/2020 07/10/2020 07/10/2020  Height 5\' 6"  - -  Weight 158 lbs 6 oz - -  BMI 25.58 - -  Systolic 118 130  Diastolic 72 77 69  Pulse 82 85 87     1. General:  in No   Acute distress   Chronically ill  -appearing 2. Psychological: Alert and  Oriented 3. Head/ENT:    Dry Mucous Membranes                          Head Non traumatic, neck supple                          Poor Dentition 4. SKIN:   decreased Skin turgor,  Skin clean Dry and intact no rash 5. Heart: Regular rate and rhythm systolic  Murmur, no Rub or gallop 6. Lungs:  , no wheezes or crackles   7. Abdomen: Soft, non-tender, Non distended  bowel sounds present 8. Lower extremities: no clubbing, cyanosis, no   edema 9. Neurologically Grossly intact, moving all 4 extremities equally  10. MSK: Normal range of motion   All other LABS:     Recent Labs  Lab 07/10/20 1122  WBC 7.8  NEUTROABS 6.5  HGB 14.7  HCT 43.4  MCV 86.1  PLT 194     Recent Labs  Lab 07/10/20 1122  NA 142  K 2.9*  CL 104  CO2 26  GLUCOSE 188*  BUN 16  CREATININE 1.45*  CALCIUM 9.8  MG 1.9     Recent Labs  Lab 07/10/20 2340  AST 15  ALT 10  ALKPHOS 92  BILITOT 0.9  PROT 6.2*  ALBUMIN  3.7       Cultures: No results found for: SDES, SPECREQUEST, CULT, REPTSTATUS   Radiological Exams on Admission: CT Head Wo Contrast  Result Date: 07/10/2020 CLINICAL DATA:  Head trauma, moderate/severe. EXAM: CT HEAD WITHOUT CONTRAST TECHNIQUE: Contiguous axial images were obtained from the base of the skull through the vertex without intravenous contrast.  COMPARISON:  Report from noncontrast head CT 01/25/2002 (images unavailable). FINDINGS: Brain: Streak and beam hardening artifact arising from aneurysm clips in the right paraclinoid region and along the course of the M2/M3 right middle cerebral artery branches limits evaluation. A right parietal approach ventricular catheter terminates just the right of midline near the right foramen of Monro. No evidence of hydrocephalus Cerebral volume is normal. Mild ill-defined hypoattenuation within the cerebral white matter is nonspecific, but compatible with chronic small vessel ischemic disease. No evidence of acute intracranial hemorrhage or acute demarcated cortical infarction. No extra-axial fluid collection. No evidence of intracranial mass. No midline shift. Vascular: Atherosclerotic calcifications. Vascular prominence in the region of the distal M1 left middle cerebral artery measuring 6 mm (series 2, image 9). 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 Skull: Calvarial fracture.  Right-sided cranioplasty. Sinuses/Orbits: Visualized orbits show no acute finding. No significant paranasal sinus disease at the imaged levels. IMPRESSION: Examination limited by streak and beam hardening artifact arising from multiple aneurysm clips. No evidence of acute intracranial hemorrhage or acute infarction. 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 CT angiography of the head is recommended for further evaluation. Right parietal approach ventricular  catheter terminating just to the right of midline near the foramen of Monro. No evidence of hydrocephalus. Mild cerebral white matter chronic small vessel ischemic disease. Electronically Signed   By: Jackey LogeKyle  Golden DO   On: 07/10/2020 11:53   DG Chest Portable 1 View  Result Date: 07/10/2020 CLINICAL DATA:  Chest pain. EXAM: PORTABLE CHEST 1 VIEW COMPARISON:  05/29/2008. FINDINGS: The heart size and mediastinal contours are within normal limits. Both lungs are clear. No visible pleural effusions or pneumothorax. No acute osseous abnormality. Partially imaged VP shunt coursing along the right neck and chest. IMPRESSION: No active disease. Electronically Signed   By: Feliberto HartsFrederick S Jones MD   On: 07/10/2020 11:24    For comparison CTA head from aug 2020 showed Anterior circulation: Postsurgical changes from right MCA and ACA aneurysm clipping. Streak artifact limits evaluation of the adjacent vasculature. Similar multifocal narrowing and irregularity of the bilateral M1 and A2 segments. Moderate stenosis of the right MCA adjacent to the aneurysm clip is unchanged. Similar focal ectasia of the left distal M1 segment extending into the M2 segment measuring up to 6 mm in diameter. Previously described focal ectasia of the distal right A2 segment is unchanged, measuring 3 mmin diameter. Mild calcified plaques of the bilateral cavernous ICAs without hemodynamically significant stenosis.   Vertebrobasilar system: Similar tortuosity and ectasia of the basal artery which measures up to 8 mm in diameter. Mild multifocal atherosclerotic narrowing of the bilateral PCA segments. Mild calcified plaques of the bilateral intradural vertebral arteries without hemodynamically significant stenosis. There is a right superior cerebellar origin moderate stenosis.   Chart has been reviewed   Assessment/Plan  60 y.o. female with medical history significant of CAD sp CABG 2020, sp Aortic valve replacement, abdominal aorta  aneurysm status post repair, brain aneurysm, hypertension, hyperlipidemia      Admitted for Chest pain and syncope had episode of A.fib w RVR   Present on Admission: . Chest pain -unclear etiology given extensive cardiac history continue to monitor on telemetry cycle cardiac enzymes obtain echogram Serial ECG Currently chest pain-free Troponin with slight trend up most likely demand in the setting of tachycardia initially D.dimer elevate at 3.39 - chest  pain non-pleuretic Pt started on heparin for a.fib once more stable can   Sudden onset of A.fib w RVR - Lasted about 30-45 min with HR up to 180 Pt felt some discomfort in her chest BP remained stable Prior to going in a.fib w RVR pt had a dose of lopressor  50 mg Cardiology consult was called plan was to start on amiodarone but pt converted back to sinus     shortly there after a.fib w RVR has recurred And Amiodarone was started Appreciate cardiology consult  . CAD (coronary artery disease) -continue statin  Plavix beta-blocker  . Essential hypertension -blood pressure very varied fluctuating continue beta-blocker Given AKI hold ACE inhibitor  . Syncope -unclear if true syncope patient states she was turning rapidly and then ended up sitting on the floor She has extensive history of ataxia and getting dizzy vertiginous with rapid movements With known history of cerebral aneurysm status post repair Repeat CT showed 6 mm lesion but that seems to be consistent with prior in 2020 Once AKI resolves could repeat CTA to further evaluate  . Elevated troponin -most likely demand ischemia mildly elevated currently chest pain-free.  Obtain echogram continue to follow serial EKG and cardiac enzymes appreciate cardiology input  . Hyperlipidemia -continue Lipitor  . Aortic aneurysm (HCC) -chronic status post repair followed by [facility] presentation not consistent with aneurysm rupture Patient currently appears to be stable  . Carotid  stenosis -obtain carotid Dopplers versus CTA neck if renal function improves  . Cerebral aneurysm without rupture -chronic comparing prior CT reports to the one obtained today appears to be stable  . Hypokalemia -- will replace and repeat in AM,  check magnesium level and replace as needed  . AKI (acute kidney injury) (HCC) -hold ACE inhibitor gently rehydrate obtain urine electrolytes Other plan as per orders.  DVT prophylaxis:  SCD      Code Status:    Code Status: Not on file FULL CODE  as per patient    I had personally discussed CODE STATUS with patient      Family Communication:   Family not at  Bedside    Disposition Plan:     To home once workup is complete and patient is stable   Following barriers for discharge:                            Electrolytes corrected                                                        Will need to be able to tolerate PO                                                     Will need consultants to evaluate patient prior to discharge                      Would benefit from PT/OT eval prior to DC  Ordered  Consults called:email to cardiology    Admission status:  ED Disposition    ED Disposition Condition Comment   Admit  Hospital [facility] [[id number]]  Level of Care: Telemetry Medical [[id number]]  I expect the patient will be discharged within 24 hours: Yes  LOW acuity---Tx typically complete <24 hrs---ACUTE conditions typically can be evaluated <24 hours---LABS likely to return to acceptable levels <24 hours---IS near functional baseline---EXPECTED to return to current living arrangement---NOT newly hypoxic: Meets criteria for 5C-Observation unit  Covid Evaluation: Asymptomatic Screening Protocol (No Symptoms)  Diagnosis: Chest pain [[id number]]  Admitting Physician: Burnadette Pop [[id number]]  Attending Physician: Burnadette Pop [[id number]]       Obs    Level of care     tele  For 12H     Lab Results  Component Value Date   SARSCOV2NAA NEGATIVE 07/10/2020     Precautions: admitted as  Covid Negative      PPE: Used by the provider:   N95  eye Goggles,  Gloves    Cassandra Fernandez 07/11/2020, 1:42 AM    [facility]     after 2 AM please page floor coverage PA If 7AM-7PM, please contact the day team taking care of the patient using [email or web address]   Patient was evaluated in the context of the global COVID-19 pandemic, which necessitated consideration that the patient might be at risk for infection with the SARS-CoV-2 virus that causes COVID-19. Institutional protocols and algorithms that pertain to the evaluation of patients at risk for COVID-19 are in a state of rapid change based on information released by regulatory bodies including the CDC and federal and state organizations. These policies and algorithms were followed during the patient's care.

## 2020-07-10 NOTE — ED Notes (Signed)
Attempted to give report to the floor nurse but she is unavailable. Requested to call back in 30 minutes

## 2020-07-10 NOTE — ED Triage Notes (Signed)
Burning sensation and heavy pain to central chest started this morning.  Vomited x 3.

## 2020-07-10 NOTE — H&P (Incomplete)
Mercadez Heitman BJY:[id number] DOB: Jul 24, 1960 DOA: 07/10/2020  PCP: Patient, No Pcp Per   Outpatient Specialists:   CARDS: At [facility]    Patient arrived to ER on 07/10/20 at 1021 Referred by Attending Burnadette Pop, MD  Patient coming from: home Lives  With family    Chief Complaint:   Chief Complaint  Patient presents with  . Chest Pain    HPI: Brittanie Dosanjh is a 60 y.o. female with medical history significant of CAD sp CABG 2020, sp Aortic valve replacement, abdominal aorta aneurysm status post repair, brain aneurysm, hypertension, hyperlipidemia     Presented with   Chest pain and syncope,  Continues to smoke She had a brief syncopal episode at home while making coffee she turned around and next thing she knee she was sitting on the floor and since then have had heaviness pressure tightness associated in her chest not worse with exertion this lasted about 4 hours She vomited bile No hx of pleuritic pain Associated with some nausea and diaphoresis Presented to med [facility] point was found to be tachycardic up to 140 s With troponin elevated Potassium was down to 2.9 creatinine 1.45 No beds available at [facility]  she is now CP free  She reports when she moves too fast she gets dizzy Ever since her 1st aneurysm was discovered 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   She has been trying to quit smoking still takes a few puffs now and then No etOh Infectious risk factors:  Reports  chest pain Nausea     Has  been vaccinated against COVID and boosted   Initial COVID TEST  NEGATIVE   Lab Results  Component Value Date   SARSCOV2NAA NEGATIVE 07/10/2020     Regarding pertinent Chronic problems:    Hyperlipidemia -  on statins lipitor      HTN on was on Norvasc, lisinopril, lopressor   chronic CHF diastolic  - last echo 2018 grade 2 diastolic CHF lasix   CAD sp CABG 2020 - On  statin,  betablocker, Plavix                 -  followed by cardiology                - last cardiac cath  Hx of CABG--07/04/2018 [facility] to [facility];   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 Onalee Hua, at [facility] They Recommend admit to medicine can see as outpatient    Hospitalist was called for admission for chest pain and syncope  The following Work up has been ordered so far:  Orders Placed This Encounter  Procedures  . Critical Care  . SARS Coronavirus 2 (TAT 6-24 hrs)  . Resp Panel by RT-PCR (Flu A&B, Covid) Nasopharyngeal Swab  . DG Chest Portable 1 View  . CT Head Wo Contrast  . Basic metabolic panel  . CBC with Differential/Platelet  . Magnesium  . Protime-INR  . APTT  . Cardiac monitoring  . Initiate Carrier Fluid Protocol  . Consult to hospitalist  ALL PATIENTS BEING ADMITTED/HAVING PROCEDURES NEED COVID-19 SCREENING  . Pulse oximetry, continuous  . CBG monitoring, ED  . EKG 12-Lead  .  Repeat EKG  . EKG 12-Lead  . Insert peripheral IV  . Insert peripheral IV  . Place in observation (patient's expected length of stay will be less than 2 midnights)    Following Medications were ordered in ER: Medications  aspirin chewable tablet 324 mg (324 mg Oral Given 07/10/20 1200)  potassium chloride SA (KLOR-CON) CR tablet 40 mEq (40 mEq Oral Given 07/10/20 1452)        Consult Orders  (From admission, onward)         Start     Ordered   07/10/20 1348  Consult to hospitalist  ALL PATIENTS BEING ADMITTED/HAVING PROCEDURES NEED COVID-19 SCREENING Called [facility] spoke with Selena Batten  Once       Comments: ALL PATIENTS BEING ADMITTED/HAVING PROCEDURES NEED COVID-19 SCREENING  Provider:  (Not yet assigned)  Question Answer Comment  Place  call to: [facility]   Reason for Consult Admit      07/10/20 1347           Significant initial  Findings: Abnormal Labs Reviewed  BASIC METABOLIC PANEL - Abnormal; Notable for the following components:      Result Value   Potassium 2.9 (*)    Glucose, Bld 188 (*)    Creatinine, Ser 1.45 (*)    GFR, Estimated 41 (*)    All other components within normal limits  TROPONIN I (HIGH SENSITIVITY) - Abnormal; Notable for the following components:   Troponin I (High Sensitivity) 19 (*)    All other components within normal limits  TROPONIN I (HIGH SENSITIVITY) - Abnormal; Notable for the following components:   Troponin I (High Sensitivity) 38 (*)    All other components within normal limits  TROPONIN I (HIGH SENSITIVITY) - Abnormal; Notable for the following components:   Troponin I (High Sensitivity) 60 (*)    All other components within normal limits    Otherwise labs showing:    Recent Labs  Lab 07/10/20 1122  NA 142  K 2.9*  CO2 26  GLUCOSE 188*  BUN 16  CREATININE 1.45*  CALCIUM 9.8  MG 1.9    Cr    Up from baseline see below Lab Results  Component Value Date   CREATININE 1.45 (H) 07/10/2020   CREATININE 0.8 09/11/2010   CREATININE 0.69 06/19/2008    No results for input(s): AST, ALT, ALKPHOS, BILITOT, PROT, ALBUMIN in the last 168 hours. Lab Results  Component Value Date   CALCIUM 9.8 07/10/2020      WBC      Component Value Date/Time   WBC 7.8 07/10/2020 1122   LYMPHSABS 0.9 07/10/2020 1122   MONOABS 0.3 07/10/2020 1122   EOSABS 0.0 07/10/2020 1122   BASOSABS 0.1 07/10/2020 1122    Plt: Lab Results  Component Value Date   PLT 194 07/10/2020    COVID-19 Labs  No results for input(s): DDIMER, FERRITIN, LDH, CRP in the last 72 hours.  Lab Results  Component Value Date   SARSCOV2NAA NEGATIVE 07/10/2020     HG/HCT  stable,      Component Value Date/Time   HGB 14.7 07/10/2020 1122   HCT 43.4 07/10/2020 1122   MCV 86.1 07/10/2020  1122    No results for input(s): LIPASE, AMYLASE in the last 168 hours. No results for input(s): AMMONIA in the last 168 hours.    Troponin 19-38-60 Cardiac Panel (last 3 results) No results for input(s): CKTOTAL, CKMB, TROPONINI, RELINDX in the last 72 hours.     ECG:  Ordered Personally reviewed by me showing: HR : 143 Rhythm: Sinus tachycardia  Abnormal ECG inferior TWI are new Anterior ST elevation   QTC 521  Repeat  HR 71 Sinus rhythm Probable LVH  with secondary repol abnrm  QTC Calculation:    441  BNP (last 3 results) No results for input(s): BNP in the last 8760 hours.      UA   no evidence of UTI      Urine analysis:    Component Value Date/Time   COLORURINE YELLOW 07/10/2020 2304   APPEARANCEUR HAZY (A) 07/10/2020 2304   LABSPEC 1.019 07/10/2020 2304   PHURINE 5.0 07/10/2020 2304   GLUCOSEU NEGATIVE 07/10/2020 2304   HGBUR NEGATIVE 07/10/2020 2304   BILIRUBINUR NEGATIVE 07/10/2020 2304   KETONESUR NEGATIVE 07/10/2020 2304   PROTEINUR NEGATIVE 07/10/2020 2304   UROBILINOGEN 0.2 09/11/2010 1133   NITRITE NEGATIVE 07/10/2020 2304   LEUKOCYTESUR NEGATIVE 07/10/2020 2304       Ordered  CT HEAD possible 6 mm annuresim CXR -  NON acute    ED Triage Vitals  Enc Vitals Group     BP 07/10/20 1036 101/71     Pulse Rate 07/10/20 1036 86     Resp 07/10/20 1036 (!) 22     Temp 07/10/20 1036 97.8 F (36.6 C)     Temp Source 07/10/20 2159 Oral     SpO2 07/10/20 1036 99 %     Weight 07/10/20 1036 158 lb 8.2 oz (71.9 kg)     Height 07/10/20 1030 5\' 6"  (1.676 m)     Head Circumference --      Peak Flow --      Pain Score 07/10/20 1029 7     Pain Loc --      Pain Edu? --      Excl. in GC? --   TMAX(24)@       Latest  Blood pressure 118/72, pulse 82, temperature 98.2 F (36.8 C), temperature source Oral, resp. rate 18, height 5\' 6"  (1.676 m), weight 71.8 kg, SpO2 99 %.       Review of Systems:    Pertinent positives include: ***  Constitutional:   No weight loss, night sweats, Fevers, chills, fatigue, weight loss  HEENT:  No headaches, Difficulty swallowing,Tooth/dental problems,Sore throat,  No sneezing, itching, ear ache, nasal congestion, post nasal drip,  Cardio-vascular:  No chest pain, Orthopnea, PND, anasarca, dizziness, palpitations.no Bilateral lower extremity swelling  GI:  No heartburn, indigestion, abdominal pain, nausea, vomiting, diarrhea, change in bowel habits, loss of appetite, melena, blood in stool, hematemesis Resp:  no shortness of breath at rest. No dyspnea on exertion, No excess mucus, no productive cough, No non-productive cough, No coughing up of blood.No change in color of mucus.No wheezing. Skin:  no rash or lesions. No jaundice GU:  no dysuria, change in color of urine, no urgency or frequency. No straining to urinate.  No flank pain.  Musculoskeletal:  No joint pain or no joint swelling. No decreased range of motion. No back pain.  Psych:  No change in mood or affect. No depression or anxiety. No memory loss.  Neuro: no localizing neurological complaints, no tingling, no weakness, no double vision, no gait abnormality, no slurred speech, no confusion  All systems reviewed and apart from HOPI all are negative  Past Medical History:   Past Medical History:  Diagnosis Date  . Hypertension       Past Surgical History:  Procedure Laterality Date  .  CEREBRAL ANEURYSM REPAIR    . CESAREAN SECTION    . CHOLECYSTECTOMY    . open heart surgery      Social History:  Ambulatory *** independently cane, walker  wheelchair bound, bed bound     reports that she has been smoking. She has never used smokeless tobacco. She reports current drug use. Drug: Marijuana. She reports that she does not drink alcohol.     Family History: *** History reviewed. No pertinent family history.  Allergies: Allergies  Allergen Reactions  . Morphine And Related Itching     Prior to Admission medications    Medication Sig Start Date End Date Taking? Authorizing Provider  amLODipine (NORVASC) 10 MG tablet Take by mouth. 12/10/19  Yes [provider]  atorvastatin (LIPITOR) 80 MG tablet Take by mouth. 12/10/19 12/09/20 Yes [provider]  clopidogrel (PLAVIX) 75 MG tablet Take 1 tablet by mouth daily. 12/10/19  Yes [provider]  furosemide (LASIX) 40 MG tablet Take by mouth. 12/10/19 12/09/20 Yes [provider]  lisinopril (ZESTRIL) 5 MG tablet Take by mouth. 12/10/19  Yes [provider]  metoprolol tartrate (LOPRESSOR) 50 MG tablet Take 1 tablet by mouth 2 (two) times daily. 12/10/19  Yes [provider]  gemfibrozil (LOPID) 600 MG tablet Take by mouth.    [provider]   Physical Exam: Vitals with BMI 07/10/2020 07/10/2020 07/10/2020  Height  - -  Weight 158 lbs 6 oz - -  BMI 25.58 - -  Systolic 118 130 161  Diastolic 72 77 69  Pulse 82 85 87     1. General:  in No ***Acute distress***increased work of breathing ***complaining of severe pain****agitated * Chronically ill *well *cachectic *toxic acutely ill -appearing 2. Psychological: Alert and *** Oriented 3. Head/ENT:   Moist *** Dry Mucous Membranes                          Head Non traumatic, neck supple                          Normal *** Poor Dentition 4. SKIN: normal *** decreased Skin turgor,  Skin clean Dry and intact no rash 5. Heart: Regular rate and rhythm no*** Murmur, no Rub or gallop 6. Lungs: ***Clear to auscultation bilaterally, no wheezes or crackles   7. Abdomen: Soft, ***non-tender, Non distended *** obese ***bowel sounds present 8. Lower extremities: no clubbing, cyanosis, no ***edema 9. Neurologically Grossly intact, moving all 4 extremities equally *** strength 5 out of 5 in all 4 extremities cranial nerves II through XII intact 10. MSK: Normal range of motion   All other LABS:     Recent Labs  Lab 07/10/20 1122  WBC 7.8  NEUTROABS 6.5   HGB 14.7  HCT 43.4  MCV 86.1  PLT 194     Recent Labs  Lab 07/10/20 1122  NA 142  K 2.9*  CL 104  CO2 26  GLUCOSE 188*  BUN 16  CREATININE 1.45*  CALCIUM 9.8  MG 1.9     No results for input(s): AST, ALT, ALKPHOS, BILITOT, PROT, ALBUMIN in the last 168 hours.     Cultures: No results found for: SDES, SPECREQUEST, CULT, REPTSTATUS   Radiological Exams on Admission: CT Head Wo Contrast  Result Date: 07/10/2020 CLINICAL DATA:  Head trauma, moderate/severe. EXAM: CT HEAD WITHOUT CONTRAST TECHNIQUE: Contiguous axial images were obtained from the  base of the skull through the vertex without intravenous contrast. COMPARISON:  Report from noncontrast head CT 01/25/2002 (images unavailable). FINDINGS: Brain: Streak and beam hardening artifact arising from aneurysm clips in the right paraclinoid region and along the course of the M2/M3 right middle cerebral artery branches limits evaluation. A right parietal approach ventricular catheter terminates just the right of midline near the right foramen of Monro. No evidence of hydrocephalus Cerebral volume is normal. Mild ill-defined hypoattenuation within the cerebral white matter is nonspecific, but compatible with chronic small vessel ischemic disease. No evidence of acute intracranial hemorrhage or acute demarcated cortical infarction. No extra-axial fluid collection. No evidence of intracranial mass. No midline shift. Vascular: Atherosclerotic calcifications. Vascular prominence in the region of the distal M1 left middle cerebral artery measuring 6 mm (series 2, image 9). 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 Skull: Calvarial fracture.  Right-sided cranioplasty. Sinuses/Orbits: Visualized orbits show no acute finding. No significant paranasal sinus disease at the imaged levels. IMPRESSION: Examination limited by streak and beam hardening artifact arising from multiple aneurysm clips. No evidence of acute intracranial hemorrhage or  acute infarction. 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 CT angiography of the head is recommended for further evaluation. Right parietal approach ventricular catheter terminating just to the right of midline near the foramen of Monro. No evidence of hydrocephalus. Mild cerebral white matter chronic small vessel ischemic disease. Electronically Signed   By: Jackey Loge DO   On: 07/10/2020 11:53   DG Chest Portable 1 View  Result Date: 07/10/2020 CLINICAL DATA:  Chest pain. EXAM: PORTABLE CHEST 1 VIEW COMPARISON:  05/29/2008. FINDINGS: The heart size and mediastinal contours are within normal limits. Both lungs are clear. No visible pleural effusions or pneumothorax. No acute osseous abnormality. Partially imaged VP shunt coursing along the right neck and chest. IMPRESSION: No active disease. Electronically Signed   By: Feliberto Harts MD   On: 07/10/2020 11:24    For comparison CTA head from aug 2020 showed Anterior circulation: Postsurgical changes from right MCA and ACA aneurysm clipping. Streak artifact limits evaluation of the adjacent vasculature. Similar multifocal narrowing and irregularity of the bilateral M1 and A2 segments. Moderate stenosis of the right MCA adjacent to the aneurysm clip is unchanged. Similar focal ectasia of the left distal M1 segment extending into the M2 segment measuring up to 6 mm in diameter. Previously described focal ectasia of the distal right A2 segment is unchanged, measuring 3 mmin diameter. Mild calcified plaques of the bilateral cavernous ICAs without hemodynamically significant stenosis.   Vertebrobasilar system: Similar tortuosity and ectasia of the basal artery which measures up to 8 mm in diameter. Mild multifocal atherosclerotic narrowing of the bilateral PCA segments. Mild calcified plaques of the bilateral intradural vertebral arteries  without hemodynamically significant stenosis. There is a right superior cerebellar origin moderate stenosis.   Chart has been reviewed   Assessment/Plan  60 y.o. female with medical history significant of CAD sp CABG 2020, sp Aortic valve replacement, abdominal aorta aneurysm status post repair, brain aneurysm, hypertension, hyperlipidemia      Admitted for Chest pain and syncope   Present on Admission: . Chest pain   Other plan as per orders.  DVT prophylaxis:  SCD      Code Status:    Code Status: Not on file FULL CODE  as per patient    I  had personally discussed CODE STATUS with patient      Family Communication:   Family not at  Bedside    Disposition Plan:     To home once workup is complete and patient is stable  ***Following barriers for discharge:                            Electrolytes corrected                               Anemia corrected                             Pain controlled with PO medications                               Afebrile, white count improving able to transition to PO antibiotics                             Will need to be able to tolerate PO                            Will likely need home health, home O2, set up                           Will need consultants to evaluate patient prior to discharge  ****EXPECT DC tomorrow                    ***Would benefit from PT/OT eval prior to DC  Ordered                   Swallow eval - SLP ordered                   Diabetes care coordinator                   Transition of care consulted                   Nutrition    consulted                  Wound care  consulted                   Palliative care    consulted                   Behavioral health  consulted                    Consults called: ***    Admission status:  ED Disposition    ED Disposition Condition Comment   Admit  Hospital Area: [facility] [[id number]]  Level of Care: Telemetry Medical [104]  I expect the patient  will be discharged within 24 hours: Yes  LOW acuity---Tx typically complete <24 hrs---ACUTE conditions typically can be evaluated <24 hours---LABS likely to return to acceptable levels <24 hours---IS near functional baseline---EXPECTED to return to current living arrangement---NOT newly hypoxic: Meets criteria for 5C-Observation unit  Covid Evaluation: Asymptomatic Screening Protocol (No Symptoms)  Diagnosis: Chest pain [147829]  Admitting Physician: Burnadette Pop [[id number]]  Attending Physician: Burnadette PopDHIKARI, AMRIT [[id number]]       Obs    Level of care     tele  For 12H    Lab Results  Component Value Date   SARSCOV2NAA NEGATIVE 07/10/2020     Precautions: admitted as  Covid Negative      PPE: Used by the provider:   N95  eye Goggles,  Gloves    Semya Klinke 07/10/2020, 11:50 PM ***  [facility]     after 2 AM please page floor coverage PA If 7AM-7PM, please contact the day team taking care of the patient using [email or web address]   Patient was evaluated in the context of the global COVID-19 pandemic, which necessitated consideration that the patient might be at risk for infection with the SARS-CoV-2 virus that causes COVID-19. Institutional protocols and algorithms that pertain to the evaluation of patients at risk for COVID-19 are in a state of rapid change based on information released by regulatory bodies including the CDC and federal and state organizations. These policies and algorithms were followed during the patient's care.

## 2020-07-10 NOTE — ED Provider Notes (Addendum)
[facility] EMERGENCY DEPARTMENT Provider Note   CSN: [id number] Arrival date & time: 07/10/20  1021     History Chief Complaint  Patient presents with  . Chest Pain    Cassandra Fernandez is a 60 y.o. female.  HPI  HPI: A 60 year old patient with a history of hypertension and hypercholesterolemia presents for evaluation of chest pain. Initial onset of pain was approximately 3-6 hours ago. The patient's chest pain is described as heaviness/pressure/tightness and is not worse with exertion. The patient complains of nausea and reports some diaphoresis. The patient's chest pain is middle- or left-sided, is not well-localized, is not sharp and does not radiate to the arms/jaw/neck. The patient has smoked in the past 90 days. The patient has no history of stroke, has no history of peripheral artery disease, denies any history of treated diabetes, has no relevant family history of coronary artery disease (first degree relative at less than age 65) and does not have an elevated BMI (>=30).   60 year old female comes in a chief complaint of chest pain and fainting.  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  Patient reports that she woke up this morning feeling well.  She was making her coffee, and had just turned, the next thing she realized she was on the floor.  Patient had no warning of syncope.  From the fall itself she is having some shoulder discomfort.  She thereafter was having severe midsternal, heaviness type chest pain that was nonradiating.  She also had a lot of GERD and had nausea with emesis and diaphoresis.  By the time I saw her, all of her  symptoms including chest pain had resolved.  Patient denies any new numbness, tingling, weakness, vision change.  She is currently not dizzy.    Past Medical History:  Diagnosis Date  . Hypertension     Patient Active Problem List   Diagnosis Date Noted  . Chest pain 07/10/2020    Past Surgical History:  Procedure Laterality Date  . CEREBRAL ANEURYSM REPAIR    . CESAREAN SECTION    . CHOLECYSTECTOMY    . open heart surgery       OB History    Gravida  3   Para  3   Term      Preterm      AB      Living        SAB      IAB      Ectopic      Multiple      Live Births              History reviewed. No pertinent family history.  Social History   Tobacco Use  . Smoking status: Current Every Day Smoker  . Smokeless tobacco: Never Used  Vaping Use  . Vaping Use: Never used  Substance Use Topics  . Alcohol use: Never  . Drug use: Yes    Types: Marijuana    Comment: yesterday    Home Medications Prior to Admission medications   Medication Sig Start Date End Date Taking? Authorizing Provider  amLODipine (NORVASC) 10 MG tablet Take by mouth. 12/10/19  Yes [provider]  atorvastatin (LIPITOR) 80 MG  tablet Take by mouth. 12/10/19 12/09/20 Yes [provider]  clopidogrel (PLAVIX) 75 MG tablet Take 1 tablet by mouth daily. 12/10/19  Yes [provider]  furosemide (LASIX) 40 MG tablet Take by mouth. 12/10/19 12/09/20 Yes [provider]  lisinopril (ZESTRIL) 5 MG tablet Take by mouth. 12/10/19  Yes [provider]  metoprolol tartrate (LOPRESSOR) 50 MG tablet Take 1 tablet by mouth 2 (two) times daily. 12/10/19  Yes [provider]  gemfibrozil (LOPID) 600 MG tablet Take by mouth.    [provider]    Allergies    Morphine and related  Review of Systems   Review of Systems  Constitutional: Positive for activity change and diaphoresis.  Respiratory: Negative for shortness of breath.    Cardiovascular: Positive for chest pain.  Gastrointestinal: Negative for nausea and vomiting.  Allergic/Immunologic: Negative for immunocompromised state.  Neurological: Positive for dizziness.  Hematological: Does not bruise/bleed easily.  All other systems reviewed and are negative.   Physical Exam Updated Vital Signs BP 108/75   Pulse 81   Temp 97.8 F (36.6 C)   Resp (!) 21   Ht 5\' 6"  (1.676 m)   Wt 71.9 kg   SpO2 96%   BMI 25.58 kg/m   Physical Exam Vitals and nursing note reviewed.  Constitutional:      Appearance: She is well-developed.  HENT:     Head: Atraumatic.  Eyes:     Extraocular Movements: EOM normal.  Cardiovascular:     Rate and Rhythm: Normal rate.     Pulses:          Radial pulses are 2+ on the right side and 2+ on the left side.     Heart sounds: Murmur heard.    Pulmonary:     Effort: Pulmonary effort is normal.     Breath sounds: Normal breath sounds.  Abdominal:     General: Bowel sounds are normal.  Musculoskeletal:     Cervical back: Normal range of motion and neck supple.     Right lower leg: No edema.     Left lower leg: No edema.  Skin:    General: Skin is warm and dry.  Neurological:     Mental Status: She is alert and oriented to person, place, and time.     ED Results / Procedures / Treatments   Labs (all labs ordered are listed, but only abnormal results are displayed) Labs Reviewed  BASIC METABOLIC PANEL - Abnormal; Notable for the following components:      Result Value   Potassium 2.9 (*)    Glucose, Bld 188 (*)    Creatinine, Ser 1.45 (*)    GFR, Estimated 41 (*)    All other components within normal limits  TROPONIN I (HIGH SENSITIVITY) - Abnormal; Notable for the following components:   Troponin I (High Sensitivity) 19 (*)    All other components within normal limits  TROPONIN I (HIGH SENSITIVITY) - Abnormal; Notable for the following components:   Troponin I (High Sensitivity) 38 (*)    All other components  within normal limits  SARS CORONAVIRUS 2 (TAT 6-24 HRS)  CBC WITH DIFFERENTIAL/PLATELET  MAGNESIUM  PROTIME-INR  APTT  CBG MONITORING, ED    EKG EKG Interpretation  Date/Time:  Thursday July 10 2020 10:22:39 EST Ventricular Rate:  143 PR Interval:  120 QRS Duration: 98 QT Interval:  338 QTC Calculation: 521 R Axis:   92 Text Interpretation: Sinus tachycardia with occasional Premature ventricular  complexes VS. SVT (ATRIAL FLUTTER).  Rightward axis Septal infarct , age undetermined Marked ST abnormality, possible inferior subendocardial injury Marked ST abnormality, possible anterolateral subendocardial injury Abnormal ECG inferior TWI are new Anterior ST elevation seen Confirmed by Derwood Kaplan [phone]) on 07/10/2020 10:54:28 AM    EKG Interpretation  Date/Time:  Thursday July 10 2020 11:39:12 EST Ventricular Rate:  71 PR Interval:  120 QRS Duration: 106 QT Interval:  405 QTC Calculation: 441 R Axis:   78 Text Interpretation: Sinus rhythm Probable LVH with secondary repol abnrm Anterior Q waves, possibly due to LVH Confirmed by Derwood Kaplan ([id number]) on 07/10/2020 11:48:09 AM        Radiology CT Head Wo Contrast  Result Date: 07/10/2020 CLINICAL DATA:  Head trauma, moderate/severe. EXAM: CT HEAD WITHOUT CONTRAST TECHNIQUE: Contiguous axial images were obtained from the base of the skull through the vertex without intravenous contrast. COMPARISON:  Report from noncontrast head CT 01/25/2002 (images unavailable). FINDINGS: Brain: Streak and beam hardening artifact arising from aneurysm clips in the right paraclinoid region and along the course of the M2/M3 right middle cerebral artery branches limits evaluation. A right parietal approach ventricular catheter terminates just the right of midline near the right foramen of Monro. No evidence of hydrocephalus Cerebral volume is normal. Mild ill-defined hypoattenuation within the cerebral white matter is nonspecific, but  compatible with chronic small vessel ischemic disease. No evidence of acute intracranial hemorrhage or acute demarcated cortical infarction. No extra-axial fluid collection. No evidence of intracranial mass. No midline shift. Vascular: Atherosclerotic calcifications. Vascular prominence in the region of the distal M1 left middle cerebral artery measuring 6 mm (series 2, image 9). 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 Skull: Calvarial fracture.  Right-sided cranioplasty. Sinuses/Orbits: Visualized orbits show no acute finding. No significant paranasal sinus disease at the imaged levels. IMPRESSION: Examination limited by streak and beam hardening artifact arising from multiple aneurysm clips. No evidence of acute intracranial hemorrhage or acute infarction. 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 CT angiography of the head is recommended for further evaluation. Right parietal approach ventricular catheter terminating just to the right of midline near the foramen of Monro. No evidence of hydrocephalus. Mild cerebral white matter chronic small vessel ischemic disease. Electronically Signed   By: Jackey Loge DO   On: 07/10/2020 11:53   DG Chest Portable 1 View  Result Date: 07/10/2020 CLINICAL DATA:  Chest pain. EXAM: PORTABLE CHEST 1 VIEW COMPARISON:  05/29/2008. FINDINGS: The heart size and mediastinal contours are within normal limits. Both lungs are clear. No visible pleural effusions or pneumothorax. No acute osseous abnormality. Partially imaged VP shunt coursing along the right neck and chest. IMPRESSION: No active disease. Electronically Signed   By: Feliberto Harts MD   On: 07/10/2020 11:24    Procedures .Critical Care Performed by: Derwood Kaplan, MD Authorized by: Derwood Kaplan, MD   Critical care provider statement:    Critical care time (minutes):  52    Critical care was necessary to treat or prevent imminent or life-threatening deterioration of the following conditions:  Cardiac failure   Critical care was time spent personally by me on the following activities:  Discussions with consultants, evaluation of patient's response to treatment, examination of patient, ordering and performing treatments and interventions, ordering and review of laboratory studies, ordering and review of radiographic studies, pulse oximetry, re-evaluation of patient's condition,  obtaining history from patient or surrogate and review of old charts     Medications Ordered in ED Medications  potassium chloride SA (KLOR-CON) CR tablet 40 mEq (has no administration in time range)  aspirin chewable tablet 324 mg (324 mg Oral Given 07/10/20 1200)    ED Course  I have reviewed the triage vital signs and the nursing notes.  Pertinent labs & imaging results that were available during my care of the patient were reviewed by me and considered in my medical decision making (see chart for details).  Clinical Course as of 07/10/20 1425  Thu Jul 10, 2020  1418 I spoke with Dr. Onalee Hua, cardiology at [facility].  They would like to admit the patient, however it might be at least 24 to 48-hour wait time.  Patient will be now admitted to [facility].  Cardiology service at [facility] will be happy to see the patient as outpatient. [AN]  1418 We will not start on anticoagulation right now. Admit to medicine.  Telemetry will need to be checked.  Potassium will be corrected in the ER.  Patient has mild AKI.  IV fluids ordered. She also has HTN of 19 and 38.. With her having CABG in 2020, suspicion for ischemic heart disease is low.  Elevated troponin is likely demand ischemia. [AN]    Clinical Course User Index [AN] Derwood Kaplan, MD   MDM Rules/Calculators/A&P HEAR Score: 31                        60 year old female with significant cardiovascular history comes in with chief  complaint of syncope.  She also had chest pain that has since resolved.  She had a CABG in 2020.  She is unsure if stents were placed.  Differential diagnosis would include ACS for her chest pain.  Currently she is chest pain-free, which is reassuring.  She has had aortic valve replacement and there is documented history of ascending aorta aneurysm -the latter could be contributing.   The syncope could be because of arrhythmia.  Patient arrived with significant tachycardia and nonspecific EKG changes.  It appears that she might have been in a flutter versus PSVT versus sinus tachycardia with PVC.  But I was assessing her, her heart rate was in the 80s and the repeat EKG does not show A. Fib. We did not have an old EKG from the recent past to compare it with.  Her vascular physical exam does reveal normal and equal 2+ dorsalis pedis and radial pulse.  I suspect that the syncope could have been because of arrhythmia.  Other possibilities include AAA.  Patient gets her primary care at [facility].  Upon reviewing records from care everywhere, she did have a vascular surgery visit last year and at that time her AAA repair site looked normal.  Patient is not having any abdominal pain, back pain.  Suspicion of leaking AAA is extremely low.  Given syncope with high risk features of cardiac disease, patient will need admission to the hospital.  CHA2DS2-VASc Score = 4  The patient's score is based upon: CHF History: Yes HTN History: Yes Diabetes History: No Stroke History: No Vascular Disease History: Yes Age Score: 0 Gender Score: 1      ASSESSMENT AND PLAN: Paroxysmal Atrial Fibrillation (ICD10:  I48.0) The patient's CHA2DS2-VASc score is 4, indicating a 4.8% annual risk of stroke.    The patient is at significant risk for stroke/thromboembolism based upon her CHA2DS2-VASc  Score of 4.  Holding anticoagulation for NOW.   Signed,  Derwood KaplanAnkit Asiah Browder, MD    07/10/2020 2:25 PM     Final Clinical  Impression(s) / ED Diagnoses Final diagnoses:  Syncope and collapse  Precordial chest pain  Elevated troponin  AKI (acute kidney injury) (HCC)  Paroxysmal tachycardia (HCC)    Rx / DC Orders ED Discharge Orders    None          Derwood KaplanNanavati, Delsa Walder, MD 07/10/20 1425

## 2020-07-10 NOTE — ED Notes (Signed)
[facility] at Bedside; Report given to Same 

## 2020-07-10 NOTE — ED Notes (Signed)
Per Previous Staff Karie Mainland, RN) Report has already been given to [facility].

## 2020-07-10 NOTE — ED Notes (Signed)
Called [facility] 

## 2020-07-10 NOTE — ED Notes (Signed)
Main Laboratory at [facility] made aware of change to COVID-19 Screening as it is now changed per [facility] Guidelines.

## 2020-07-10 NOTE — ED Notes (Signed)
Pt reports she was getting ready to drink her coffee began to have chest pressure. Pt states she turned around and was picking herself up off the floor. Pt denies any loc.

## 2020-07-11 ENCOUNTER — Observation Stay (HOSPITAL_COMMUNITY): Payer: Self-pay

## 2020-07-11 ENCOUNTER — Ambulatory Visit (HOSPITAL_COMMUNITY): Payer: Self-pay | Attending: Internal Medicine

## 2020-07-11 ENCOUNTER — Other Ambulatory Visit: Payer: Self-pay | Admitting: Internal Medicine

## 2020-07-11 ENCOUNTER — Encounter (HOSPITAL_COMMUNITY): Payer: Self-pay | Admitting: Internal Medicine

## 2020-07-11 DIAGNOSIS — R55 Syncope and collapse: Secondary | ICD-10-CM

## 2020-07-11 DIAGNOSIS — E876 Hypokalemia: Secondary | ICD-10-CM | POA: Diagnosis present

## 2020-07-11 DIAGNOSIS — I4891 Unspecified atrial fibrillation: Principal | ICD-10-CM

## 2020-07-11 DIAGNOSIS — N179 Acute kidney failure, unspecified: Secondary | ICD-10-CM | POA: Diagnosis present

## 2020-07-11 LAB — CBC WITH DIFFERENTIAL/PLATELET
Abs Immature Granulocytes: 0.01 10*3/uL (ref 0.00–0.07)
Basophils Absolute: 0.1 10*3/uL (ref 0.0–0.1)
Basophils Relative: 1 %
Eosinophils Absolute: 0.1 10*3/uL (ref 0.0–0.5)
Eosinophils Relative: 1 %
HCT: 38.6 % (ref 36.0–46.0)
Hemoglobin: 13.2 g/dL (ref 12.0–15.0)
Immature Granulocytes: 0 %
Lymphocytes Relative: 32 %
Lymphs Abs: 2.4 10*3/uL (ref 0.7–4.0)
MCH: 29.4 pg (ref 26.0–34.0)
MCHC: 34.2 g/dL (ref 30.0–36.0)
MCV: 86 fL (ref 80.0–100.0)
Monocytes Absolute: 0.7 10*3/uL (ref 0.1–1.0)
Monocytes Relative: 9 %
Neutro Abs: 4.4 10*3/uL (ref 1.7–7.7)
Neutrophils Relative %: 57 %
Platelets: 162 10*3/uL (ref 150–400)
RBC: 4.49 MIL/uL (ref 3.87–5.11)
RDW: 13.9 % (ref 11.5–15.5)
WBC: 7.5 10*3/uL (ref 4.0–10.5)
nRBC: 0 % (ref 0.0–0.2)

## 2020-07-11 LAB — LIPID PANEL
Cholesterol: 128 mg/dL (ref 0–200)
HDL: 26 mg/dL — ABNORMAL LOW (ref >40)
LDL Cholesterol: 61 mg/dL (ref 0–99)
Total CHOL/HDL Ratio: 4.9 RATIO
Triglycerides: 206 mg/dL — ABNORMAL HIGH (ref <150)
VLDL: 41 mg/dL — ABNORMAL HIGH (ref 0–40)

## 2020-07-11 LAB — BASIC METABOLIC PANEL
Anion gap: 9 (ref 5–15)
BUN: 9 mg/dL (ref 8–23)
CO2: 22 mmol/L (ref 22–32)
Calcium: 9.4 mg/dL (ref 8.9–10.3)
Chloride: 110 mmol/L (ref 98–111)
Creatinine, Ser: 1.05 mg/dL — ABNORMAL HIGH (ref 0.44–1.00)
GFR, Estimated: >60 mL/min (ref >60)
Glucose, Bld: 145 mg/dL — ABNORMAL HIGH (ref 70–99)
Potassium: 3.7 mmol/L (ref 3.5–5.1)
Sodium: 141 mmol/L (ref 135–145)

## 2020-07-11 LAB — COMPREHENSIVE METABOLIC PANEL
ALT: 10 U/L (ref 0–44)
ALT: 9 U/L (ref 0–44)
AST: 15 U/L (ref 15–41)
AST: 13 U/L — ABNORMAL LOW (ref 15–41)
Albumin: 3.7 g/dL (ref 3.5–5.0)
Albumin: 3.6 g/dL (ref 3.5–5.0)
Alkaline Phosphatase: 92 U/L (ref 38–126)
Alkaline Phosphatase: 86 U/L (ref 38–126)
Anion gap: 11 (ref 5–15)
Anion gap: 12 (ref 5–15)
BUN: 15 mg/dL (ref 8–23)
BUN: 14 mg/dL (ref 8–23)
CO2: 26 mmol/L (ref 22–32)
CO2: 24 mmol/L (ref 22–32)
Calcium: 9.8 mg/dL (ref 8.9–10.3)
Calcium: 9.4 mg/dL (ref 8.9–10.3)
Chloride: 106 mmol/L (ref 98–111)
Chloride: 106 mmol/L (ref 98–111)
Creatinine, Ser: 1.2 mg/dL — ABNORMAL HIGH (ref 0.44–1.00)
Creatinine, Ser: 1 mg/dL (ref 0.44–1.00)
GFR, Estimated: 52 mL/min — ABNORMAL LOW (ref >60)
GFR, Estimated: >60 mL/min (ref >60)
Glucose, Bld: 175 mg/dL — ABNORMAL HIGH (ref 70–99)
Glucose, Bld: 139 mg/dL — ABNORMAL HIGH (ref 70–99)
Potassium: 3 mmol/L — ABNORMAL LOW (ref 3.5–5.1)
Potassium: 2.9 mmol/L — ABNORMAL LOW (ref 3.5–5.1)
Sodium: 143 mmol/L (ref 135–145)
Sodium: 142 mmol/L (ref 135–145)
Total Bilirubin: 0.9 mg/dL (ref 0.3–1.2)
Total Bilirubin: 0.7 mg/dL (ref 0.3–1.2)
Total Protein: 6.2 g/dL — ABNORMAL LOW (ref 6.5–8.1)
Total Protein: 6 g/dL — ABNORMAL LOW (ref 6.5–8.1)

## 2020-07-11 LAB — HIV ANTIBODY (ROUTINE TESTING W REFLEX): HIV Screen 4th Generation wRfx: NONREACTIVE

## 2020-07-11 LAB — RAPID URINE DRUG SCREEN, HOSP PERFORMED
Amphetamines: NOT DETECTED
Barbiturates: NOT DETECTED
Benzodiazepines: NOT DETECTED
Cocaine: NOT DETECTED
Opiates: NOT DETECTED
Tetrahydrocannabinol: POSITIVE — AB

## 2020-07-11 LAB — ECHOCARDIOGRAM COMPLETE
AR max vel: 2.03 cm2
AV Area VTI: 2.75 cm2
AV Area mean vel: 2.42 cm2
AV Mean grad: 9 mmHg
AV Peak grad: 19.9 mmHg
Ao pk vel: 2.23 m/s
Area-P 1/2: 2.22 cm2
Height: 66 in
S' Lateral: 2 cm
Weight: 2542.4 oz

## 2020-07-11 LAB — BRAIN NATRIURETIC PEPTIDE: B Natriuretic Peptide: 515.2 pg/mL — ABNORMAL HIGH (ref 0.0–100.0)

## 2020-07-11 LAB — D-DIMER, QUANTITATIVE: D-Dimer, Quant: 3.39 ug/mL-FEU — ABNORMAL HIGH (ref 0.00–0.50)

## 2020-07-11 LAB — PHOSPHORUS: Phosphorus: 2.2 mg/dL — ABNORMAL LOW (ref 2.5–4.6)

## 2020-07-11 LAB — TROPONIN I (HIGH SENSITIVITY)
Troponin I (High Sensitivity): 81 ng/L — ABNORMAL HIGH (ref <18)
Troponin I (High Sensitivity): 70 ng/L — ABNORMAL HIGH (ref <18)

## 2020-07-11 LAB — HEPARIN LEVEL (UNFRACTIONATED): Heparin Unfractionated: 0.27 IU/mL — ABNORMAL LOW (ref 0.30–0.70)

## 2020-07-11 LAB — TSH: TSH: 1.538 u[IU]/mL (ref 0.350–4.500)

## 2020-07-11 LAB — CREATININE, URINE, RANDOM: Creatinine, Urine: 276.06 mg/dL

## 2020-07-11 LAB — MAGNESIUM: Magnesium: 1.8 mg/dL (ref 1.7–2.4)

## 2020-07-11 LAB — SODIUM, URINE, RANDOM: Sodium, Ur: 11 mmol/L

## 2020-07-11 MED ORDER — SODIUM CHLORIDE 0.9 % IV SOLN: INTRAVENOUS | Status: DC

## 2020-07-11 MED ORDER — CLOPIDOGREL BISULFATE 75 MG PO TABS
75.0000 mg | ORAL_TABLET | Freq: Every day | ORAL | Status: DC
  Administered 2020-07-11: 75 mg via ORAL
  Filled 2020-07-11: qty 1

## 2020-07-11 MED ORDER — AMIODARONE HCL IN DEXTROSE 360-4.14 MG/200ML-% IV SOLN: 30.0000 mg/h | INTRAVENOUS | Status: DC

## 2020-07-11 MED ORDER — ATORVASTATIN CALCIUM 80 MG PO TABS: 80.0000 mg | ORAL_TABLET | Freq: Every day | ORAL | Status: DC

## 2020-07-11 MED ORDER — AMIODARONE HCL IN DEXTROSE 360-4.14 MG/200ML-% IV SOLN
60.0000 mg/h | INTRAVENOUS | Status: DC
  Filled 2020-07-11: qty 200

## 2020-07-11 MED ORDER — AMIODARONE LOAD VIA INFUSION
150.0000 mg | Freq: Once | INTRAVENOUS | Status: DC
  Filled 2020-07-11: qty 83.34

## 2020-07-11 MED ORDER — GEMFIBROZIL 600 MG PO TABS
600.0000 mg | ORAL_TABLET | Freq: Two times a day (BID) | ORAL | Status: DC
  Administered 2020-07-11: 600 mg via ORAL
  Filled 2020-07-11: qty 1

## 2020-07-11 MED ORDER — APIXABAN 5 MG PO TABS
5.0000 mg | ORAL_TABLET | Freq: Two times a day (BID) | ORAL | Status: DC
  Administered 2020-07-11 – 2020-07-12 (×3): 5 mg via ORAL
  Filled 2020-07-11 (×3): qty 1

## 2020-07-11 MED ORDER — HEPARIN BOLUS VIA INFUSION
4000.0000 [IU] | Freq: Once | INTRAVENOUS | Status: AC
  Administered 2020-07-11: 4000 [IU] via INTRAVENOUS
  Filled 2020-07-11: qty 4000

## 2020-07-11 MED ORDER — APIXABAN 5 MG PO TABS: 5.0000 mg | ORAL_TABLET | Freq: Two times a day (BID) | ORAL | 1 refills | Status: DC

## 2020-07-11 MED ORDER — METOPROLOL TARTRATE 50 MG PO TABS
50.0000 mg | ORAL_TABLET | Freq: Two times a day (BID) | ORAL | Status: DC
  Administered 2020-07-11: 50 mg via ORAL
  Filled 2020-07-11: qty 1

## 2020-07-11 MED ORDER — ACETAMINOPHEN 650 MG RE SUPP: 650.0000 mg | Freq: Four times a day (QID) | RECTAL | Status: DC | PRN

## 2020-07-11 MED ORDER — ASPIRIN 81 MG PO CHEW: 81.0000 mg | CHEWABLE_TABLET | Freq: Every day | ORAL | Status: DC

## 2020-07-11 MED ORDER — APIXABAN 5 MG PO TABS: 5.0000 mg | ORAL_TABLET | Freq: Two times a day (BID) | ORAL | 0 refills | Status: AC

## 2020-07-11 MED ORDER — POTASSIUM CHLORIDE CRYS ER 20 MEQ PO TBCR: 40.0000 meq | EXTENDED_RELEASE_TABLET | Freq: Four times a day (QID) | ORAL | Status: DC

## 2020-07-11 MED ORDER — AMIODARONE HCL 200 MG PO TABS
400.0000 mg | ORAL_TABLET | Freq: Every day | ORAL | Status: DC
  Administered 2020-07-11 – 2020-07-12 (×2): 400 mg via ORAL
  Filled 2020-07-11 (×2): qty 2

## 2020-07-11 MED ORDER — AMIODARONE HCL IN DEXTROSE 360-4.14 MG/200ML-% IV SOLN
60.0000 mg/h | INTRAVENOUS | Status: DC
  Administered 2020-07-11 (×2): 60 mg/h via INTRAVENOUS
  Filled 2020-07-11: qty 200

## 2020-07-11 MED ORDER — AMIODARONE LOAD VIA INFUSION
150.0000 mg | Freq: Once | INTRAVENOUS | Status: AC
  Administered 2020-07-11: 150 mg via INTRAVENOUS

## 2020-07-11 MED ORDER — HEPARIN (PORCINE) 25000 UT/250ML-% IV SOLN
1100.0000 [IU]/h | INTRAVENOUS | Status: DC
  Administered 2020-07-11: 1000 [IU]/h via INTRAVENOUS
  Filled 2020-07-11: qty 250

## 2020-07-11 MED ORDER — POTASSIUM CHLORIDE 10 MEQ/100ML IV SOLN
10.0000 meq | INTRAVENOUS | Status: AC
  Administered 2020-07-11 (×4): 10 meq via INTRAVENOUS
  Filled 2020-07-11 (×4): qty 100

## 2020-07-11 MED ORDER — FENOFIBRATE 160 MG PO TABS
160.0000 mg | ORAL_TABLET | Freq: Every day | ORAL | Status: DC
  Administered 2020-07-11 – 2020-07-12 (×2): 160 mg via ORAL
  Filled 2020-07-11 (×2): qty 1

## 2020-07-11 MED ORDER — MAGNESIUM SULFATE 2 GM/50ML IV SOLN
2.0000 g | Freq: Once | INTRAVENOUS | Status: AC
  Administered 2020-07-11: 2 g via INTRAVENOUS
  Filled 2020-07-11: qty 50

## 2020-07-11 MED ORDER — ROSUVASTATIN CALCIUM 20 MG PO TABS
40.0000 mg | ORAL_TABLET | Freq: Every day | ORAL | Status: DC
  Administered 2020-07-11: 40 mg via ORAL
  Filled 2020-07-11: qty 2

## 2020-07-11 MED ORDER — ACETAMINOPHEN 325 MG PO TABS: 650.0000 mg | ORAL_TABLET | Freq: Four times a day (QID) | ORAL | Status: DC | PRN

## 2020-07-11 MED ORDER — SODIUM CHLORIDE 0.9 % IV SOLN
75.0000 mL/h | INTRAVENOUS | Status: DC
  Administered 2020-07-11: 75 mL/h via INTRAVENOUS

## 2020-07-11 MED ORDER — POTASSIUM CHLORIDE CRYS ER 20 MEQ PO TBCR
40.0000 meq | EXTENDED_RELEASE_TABLET | Freq: Four times a day (QID) | ORAL | Status: AC
  Administered 2020-07-11: 40 meq via ORAL
  Filled 2020-07-11: qty 2

## 2020-07-11 MED FILL — ELIQUIS 5 MG TABLET: 5 | 30 days supply | Qty: 60 | Fill #0

## 2020-07-11 NOTE — Progress Notes (Signed)
  Amiodarone Drug - Drug Interaction Consult Note  Recommendations: Monitor HR and assess pt for muscle pain/weakness. Amiodarone is metabolized by the cytochrome P450 system and therefore has the potential to cause many drug interactions. Amiodarone has an average plasma half-life of 50 days (range 20 to 100 days).   There is potential for drug interactions to occur several weeks or months after stopping treatment and the onset of drug interactions may be slow after initiating amiodarone.   [x]  Statins: Increased risk of myopathy. Simvastatin- restrict  dose to 20mg  daily. Other statins: counsel patients to report any muscle pain or weakness immediately.  []  Anticoagulants: Amiodarone can increase anticoagulant effect. Consider warfarin dose reduction. Patients should be monitored closely and the dose of anticoagulant altered accordingly, remembering that amiodarone levels take several weeks to stabilize.  []  Antiepileptics: Amiodarone can increase plasma concentration of phenytoin, the dose should be reduced. Note that small changes in phenytoin dose can result in large changes in levels. Monitor patient and counsel on signs of toxicity.  [x]  Beta blockers: increased risk of bradycardia, AV block and myocardial depression. Sotalol - avoid concomitant use.  []   Calcium channel blockers (diltiazem and verapamil): increased risk of bradycardia, AV block and myocardial depression.  []   Cyclosporine: Amiodarone increases levels of cyclosporine. Reduced dose of cyclosporine is recommended.  []  Digoxin dose should be halved when amiodarone is started.  []  Diuretics: increased risk of cardiotoxicity if hypokalemia occurs.  []  Oral hypoglycemic agents (glyburide, glipizide, glimepiride): increased risk of hypoglycemia. Patient's glucose levels should be monitored closely when initiating amiodarone therapy.   []  Drugs that prolong the QT interval:  Torsades de pointes risk may be increased with  concurrent use - avoid if possible.  Monitor QTc, also keep magnesium/potassium WNL if concurrent therapy can't be avoided. Antibiotics: e.g. fluoroquinolones, erythromycin. . Antiarrhythmics: e.g. quinidine, procainamide, disopyramide,  sotalol. . Antipsychotics: e.g. phenothiazines, haloperidol.  . Lithium, tricyclic antidepressants, and methadone.    Thank You,  , PharmD, BCPS   07/11/2020 1:45 AM

## 2020-07-11 NOTE — Consult Note (Signed)
Cardiology Consult    Patient ID: Cassandra Fernandez MRN: [id number], DOB/AGE: 1960/08/13   Admit date: 07/10/2020 Date of Consult: 07/11/2020  Primary Physician: Patient, No Pcp Per Primary Cardiologist: No primary care provider on file. Requesting Provider: Burnadette Pop, MD  Patient Profile    Cassandra Fernandez is a 60 y.o. female with a history of coronary artery bypass grafting and revision bioprosthetic aortic valve replacement 2020, abdominal aortic aneurysm with prior repair in 2013, ascending aortic aneurysm repair at the time of aortic root replacement in 2012 on whom we are consulted for atrial fibrillation.  History of Present Illness    This 60 year old female with a history of coronary artery bypass grafting and revision of bioprosthetic aortic valve replacement February 2020 following a prior ascending aortic aneurysm repair with root replacement in 2012, hypertension, hyperlipidemia, cerebral aneurysm, on whom we are consulted for atrial fibrillation.  He was in her usual state of health this morning and was working on cleaning out her coffee filter when all of a sudden she fell down.  She denies having lost consciousness during this.  But felt like her legs gave out from under her and she was just going down no matter what.  She recovered uneventfully, went and sat on the couch, and sipped on a Coca-Cola.  She noticed that she had a little bit of chest pressure while she was enjoying the Coca-Cola but was not sure if it was just some indigestion.  Given that she was overall not feeling well and her recent fall her daughter and encouraged her to present to the ED.  At the emergency department she actually reported chest pain which she denied to me.  She reported a heaviness/pressure/tightness not worsened by exertion with some associated nausea and diaphoresis.  Given complaints of fall characterizes syncope by the ED and chest pain with mildly elevated troponin in the setting of CABG she was  transferred to our institution for further evaluation.  He denies any ongoing chest pain.  Troponin trended 19, 38, 60, 81.  BNP elevated at 515.  I was consulted when she developed a paroxysm of atrial fibrillation with rates in the 160s earlier this morning.  This is a first occurrence for the patient.  She was given a bolus of amiodarone and converted uneventfully.  She had a second recurrence on review of telemetry that lasted about 20 minutes with subsequent conversion.  She did have a significant conversion pause after the first episode of about 3 seconds.  Pause after second episode was not that long only about the second.  ECG shows atrial fibrillation with rapid ventricular response with secondary QRS widening and repolarization abnormalities in the setting of left ventricular hypertrophy.  Past Medical History   Past Medical History:  Diagnosis Date  . Hypertension     Past Surgical History:  Procedure Laterality Date  . CEREBRAL ANEURYSM REPAIR    . CESAREAN SECTION    . CHOLECYSTECTOMY    . open heart surgery       Allergies  Allergen Reactions  . Morphine And Related Itching   Inpatient Medications    . atorvastatin  80 mg Oral q1800  . clopidogrel  75 mg Oral Daily  . gemfibrozil  600 mg Oral BID AC  . metoprolol tartrate  50 mg Oral BID    Family History    Family History  Problem Relation Age of Onset  . Hypertension Other    She indicated that the status of her  other is unknown.   Social History    Social History   Socioeconomic History  . Marital status: Single    Spouse name: Not on file  . Number of children: Not on file  . Years of education: Not on file  . Highest education level: Not on file  Occupational History  . Not on file  Tobacco Use  . Smoking status: Current Every Day Smoker  . Smokeless tobacco: Never Used  Vaping Use  . Vaping Use: Never used  Substance and Sexual Activity  . Alcohol use: Never  . Drug use: Yes    Types:  Marijuana    Comment: yesterday  . Sexual activity: Not on file  Other Topics Concern  . Not on file  Social History Narrative  . Not on file   Social Determinants of Health   Financial Resource Strain: Not on file  Food Insecurity: Not on file  Transportation Needs: Not on file  Physical Activity: Not on file  Stress: Not on file  Social Connections: Not on file  Intimate Partner Violence: Not on file     Review of Systems    General:  No chills, fever, night sweats or weight changes.  Cardiovascular:  No chest pain, dyspnea on exertion, edema, orthopnea, palpitations, paroxysmal nocturnal dyspnea. Dermatological: No rash, lesions/masses Respiratory: No cough, dyspnea Urologic: No hematuria, dysuria Abdominal:   No nausea, vomiting, diarrhea, bright red blood per rectum, melena, or hematemesis Neurologic:  No visual changes, wkns, changes in mental status. All other systems reviewed and are otherwise negative except as noted above.  Physical Exam    Blood pressure (!) 138/91, pulse 69, temperature 98.7 F (37.1 C), temperature source Oral, resp. rate 18, height 5\' 6"  (1.676 m), weight 71.8 kg, SpO2 98 %.     Intake/Output Summary (Last 24 hours) at 07/11/2020 0408 Last data filed at 07/10/2020 2200 Gross per 24 hour  Intake 300 ml  Output 0 ml  Net 300 ml   Wt Readings from Last 3 Encounters:  07/10/20 71.8 kg    CONSTITUTIONAL: alert and conversant, well-appearing, nourished, no distress HEENT: oropharynx clear and moist, no mucosal lesions, normal dentition, conjunctiva normal, EOM intact, pupils equal, no lid lag. NECK: supple, no cervical adenopathy, no thyromegaly CARDIOVASCULAR: Regular rhythm. No gallop, murmur, or rub. Normal S1/S2. Radial pulses intact. JVP flat. No carotid bruits. PULMONARY/CHEST WALL: no deformities, normal breath sounds bilaterally, normal work of breathing ABDOMINAL: soft, non-tender, non-distended EXTREMITIES: no edema or muscle  atrophy, warm and well-perfused SKIN: Dry and intact without apparent rashes or wounds. NEUROLOGIC: alert, normal gait, no abnormal movements, cranial nerves grossly intact.   Labs    Troponin XIX uptrending 81 Normal range liver function GFR 52 BNP 515 Normal hematology  Radiology Studies    CT Head Wo Contrast  Result Date: 07/10/2020 CLINICAL DATA:  Head trauma, moderate/severe. EXAM: CT HEAD WITHOUT CONTRAST TECHNIQUE: Contiguous axial images were obtained from the base of the skull through the vertex without intravenous contrast. COMPARISON:  Report from noncontrast head CT 01/25/2002 (images unavailable). FINDINGS: Brain: Streak and beam hardening artifact arising from aneurysm clips in the right paraclinoid region and along the course of the M2/M3 right middle cerebral artery branches limits evaluation. A right parietal approach ventricular catheter terminates just the right of midline near the right foramen of Monro. No evidence of hydrocephalus Cerebral volume is normal. Mild ill-defined hypoattenuation within the cerebral white matter is nonspecific, but compatible with chronic small vessel ischemic disease.  No evidence of acute intracranial hemorrhage or acute demarcated cortical infarction. No extra-axial fluid collection. No evidence of intracranial mass. No midline shift. Vascular: Atherosclerotic calcifications. Vascular prominence in the region of the distal M1 left middle cerebral artery measuring 6 mm (series 2, image 9). 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 Skull: Calvarial fracture.  Right-sided cranioplasty. Sinuses/Orbits: Visualized orbits show no acute finding. No significant paranasal sinus disease at the imaged levels. IMPRESSION: Examination limited by streak and beam hardening artifact arising from multiple aneurysm clips. No evidence of acute intracranial hemorrhage or acute infarction. 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 CT angiography of the head is recommended for further evaluation. Right parietal approach ventricular catheter terminating just to the right of midline near the foramen of Monro. No evidence of hydrocephalus. Mild cerebral white matter chronic small vessel ischemic disease. Electronically Signed   By: Jackey Loge DO   On: 07/10/2020 11:53   DG Chest Portable 1 View  Result Date: 07/10/2020 CLINICAL DATA:  Chest pain. EXAM: PORTABLE CHEST 1 VIEW COMPARISON:  05/29/2008. FINDINGS: The heart size and mediastinal contours are within normal limits. Both lungs are clear. No visible pleural effusions or pneumothorax. No acute osseous abnormality. Partially imaged VP shunt coursing along the right neck and chest. IMPRESSION: No active disease. Electronically Signed   By: Feliberto Harts MD   On: 07/10/2020 11:24    ECG & Cardiac Imaging   Coronary angiogram prior to her bypass and January 2020.  50% proximal LAD stenosis of the mid LAD that 70% of mid LAD distal 70% tandem.  D1 70%.  D2 70%.  Small ramus intermedius.  Moderate left circumflex disease.  Isolated LIMA to LAD occurring after this angiogram.  Assessment & Plan    This 60 year old female with a history of coronary artery bypass grafting and revision of bioprosthetic aortic valve replacement February 2020 following a prior ascending aortic aneurysm repair with root replacement in 2012, hypertension, hyperlipidemia, cerebral aneurysm, on whom we are consulted for atrial fibrillation.  He did have a fall today which was unusual for her per the patient.  She complains of issues with chronic dizziness and the disequilibrium however.  Pause was only 3 seconds so I am not convinced that this is cardiogenic syncope that we witnessed here.  She would probably benefit from staying in normal sinus rhythm, however, and observing for persistent symptoms.  If she  can stay in sinus without recurrences of her A. fib and continues to have issues with disequilibrium and falls that would push me away from pacemaker placement for possible tachybradycardia syndrome.  Suspect that her mildly elevated troponin in the absence of ongoing chest pain is likely due to some demand ischemia she was having paroxysms of atrial fibrillation earlier today.  She is not aware of the symptoms.  Recommend continuing IV anticoagulant and switching to DOAC once prior plan for no invasive procedures.  Can continue amiodarone infusion transition to amiodarone oral load following this.  Problem list New onset atrial fibrillation with rapid ventricular response, CHA2DS2-VASc at least 4 Postconversion pauses History of coronary artery disease status post coronary artery bypass grafting February 2020 Bioprosthetic AVR x2, last February 2020 History of aortic ascending aneurysm repair. History of aortic ascending aneurysm repair. Hypertension Hyperlipidemia  Recommendations -Continue amiodarone infusion x24 hours and transition to oral amiodarone 200 mg twice daily for  20 days followed by amiodarone 200 mg daily. -Transition to apixaban when appropriate, 5 mg twice daily -Recommend stopping clopidogrel and transitioning to aspirin with apixaban in the setting of new anticoagulant -Stop metoprolol given significant postconversion pauses -Continue home amlodipine, high intensity statin. -Trend troponin to peak.   Signed, Regino Schultze, MD 07/11/2020, 4:08 AM  For questions or updates, please contact   Please consult [email or web address] for contact info under Cardiology/STEMI.

## 2020-07-11 NOTE — Progress Notes (Signed)
PROGRESS NOTE    Cassandra Fernandez  ZOX:[id number] DOB: [birth year] DOA: 07/10/2020 PCP: Patient, No Pcp Per  Brief Narrative: 60 year old female with history of CAD, CABG in 2020, aortic valve replacement, abdominal aortic aneurysm presented to the ED after a near syncopal event associated with chest pain, in the ED she was noted to be in A. fib RVR   Assessment & Plan:    Atrial fibrillation with rapid ventricular response Near syncope -Started on IV amiodarone drip overnight, converted to sinus rhythm this morning -Start oral amiodarone this afternoon, beta-blocker stopped due to long postconversion pauses -Discontinue IV heparin, start oral apixaban -Ambulate, monitor on telemetry  Chest pain, demand ischemia -Resolved, mildly elevated cardiac enzymes likely secondary to A. fib RVR, no symptoms of ACS  History of CAD/CABG -Beta-blocker discontinued, she had long postconversion pauses -Now on amiodarone, continue Eliquis, aspirin discontinued  Severe hypokalemia -Replace, mag slightly low, add mag sulfate x1  Aortic aneurysm (HCC)  -chronic status post repair  -Followed by vascular surgery at [facility]  H/o Cerebral aneurysm   -chronic, stable   Hypokalemia - -replete  AKI -Improved with hydration, stop IV fluid  DVT prophylaxis: Eliquis Code Status: Full code Family Communication: No family at bedside Disposition Plan:  Status is: Observation  The patient will require care spanning > 2 midnights and should be moved to inpatient because: Inpatient level of care appropriate due to severity of illness  Dispo: The patient is from: Home              Anticipated d/c is to: Home              Anticipated d/c date is: 1 day              Patient currently is not medically stable to d/c.   Difficult to place patient No  Consultants:   Cardiology   Procedures:   Antimicrobials:    Subjective: -Feels okay today  Objective: Vitals:   07/11/20 0147 07/11/20 0448  07/11/20 0717 07/11/20 1027  BP: (!) 138/91 131/71 (!) 130/113   Pulse: 69 74 68   Resp:   16 (!) 25  Temp:  98.1 F (36.7 C) 97.9 F (36.6 C)   TempSrc:  Oral Oral   SpO2:  96% 96%   Weight:  72.1 kg  72.5 kg  Height:    5\' 6"  (1.676 m)    Intake/Output Summary (Last 24 hours) at 07/11/2020 1123 Last data filed at 07/11/2020 0830 Gross per 24 hour  Intake 1868.4 ml  Output 900 ml  Net 968.4 ml   Filed Weights   07/10/20 2159 07/11/20 0448 07/11/20 1027  Weight: 71.8 kg 72.1 kg 72.5 kg    Examination:  General exam: Pleasant female sitting up in bed, AAOx3, no distress CVS: S1-S2, regular rate rhythm Lungs: Clear bilaterally Abdomen: Soft, nontender, bowel sounds present Extremities: No edema Skin: No rashes on exposed skin  Data Reviewed:   CBC: Recent Labs  Lab 07/10/20 1122 07/11/20 0254  WBC 7.8 7.5  NEUTROABS 6.5 4.4  HGB 14.7 13.2  HCT 43.4 38.6  MCV 86.1 86.0  PLT 194 162   Basic Metabolic Panel: Recent Labs  Lab 07/10/20 1122 07/10/20 2340 07/11/20 0254  NA 142 143 142  K 2.9* 3.0* 2.9*  CL 104 106 106  CO2 26 26 24   GLUCOSE 188* 175* 139*  BUN 16 15 14   CREATININE 1.45* 1.20* 1.00  CALCIUM 9.8 9.8 9.4  MG 1.9  --  1.8  PHOS  --   --  2.2*   GFR: Estimated Creatinine Clearance: 60.2 mL/min (by C-G formula based on SCr of 1 mg/dL). Liver Function Tests: Recent Labs  Lab 07/10/20 2340 07/11/20 0254  AST 15 13*  ALT 10 9  ALKPHOS 92 86  BILITOT 0.9 0.7  PROT 6.2* 6.0*  ALBUMIN 3.7 3.6   No results for input(s): LIPASE, AMYLASE in the last 168 hours. No results for input(s): AMMONIA in the last 168 hours. Coagulation Profile: Recent Labs  Lab 07/10/20 1122  INR 1.0   Cardiac Enzymes: No results for input(s): CKTOTAL, CKMB, CKMBINDEX, TROPONINI in the last 168 hours. BNP (last 3 results) No results for input(s): PROBNP in the last 8760 hours. HbA1C: No results for input(s): HGBA1C in the last 72 hours. CBG: No results for  input(s): GLUCAP in the last 168 hours. Lipid Profile: No results for input(s): CHOL, HDL, LDLCALC, TRIG, CHOLHDL, LDLDIRECT in the last 72 hours. Thyroid Function Tests: Recent Labs    07/11/20 0254  TSH 1.538   Anemia Panel: No results for input(s): VITAMINB12, FOLATE, FERRITIN, TIBC, IRON, RETICCTPCT in the last 72 hours. Urine analysis:    Component Value Date/Time   COLORURINE YELLOW 07/10/2020 2304   APPEARANCEUR HAZY (A) 07/10/2020 2304   LABSPEC 1.019 07/10/2020 2304   PHURINE 5.0 07/10/2020 2304   GLUCOSEU NEGATIVE 07/10/2020 2304   HGBUR NEGATIVE 07/10/2020 2304   BILIRUBINUR NEGATIVE 07/10/2020 2304   KETONESUR NEGATIVE 07/10/2020 2304   PROTEINUR NEGATIVE 07/10/2020 2304   UROBILINOGEN 0.2 09/11/2010 1133   NITRITE NEGATIVE 07/10/2020 2304   LEUKOCYTESUR NEGATIVE 07/10/2020 2304   Sepsis Labs: @LABRCNTIP (procalcitonin:4,lacticidven:4)  ) Recent Results (from the past 240 hour(s))  SARS CORONAVIRUS 2 (TAT 6-24 HRS)     Status: None   Collection Time: 07/10/20 11:51 AM  Result Value Ref Range Status   SARS Coronavirus 2 NEGATIVE NEGATIVE Final    Comment: (NOTE) SARS-CoV-2 target nucleic acids are NOT DETECTED.  The SARS-CoV-2 RNA is generally detectable in upper and lower respiratory specimens during the acute phase of infection. Negative results do not preclude SARS-CoV-2 infection, do not rule out co-infections with other pathogens, and should not be used as the sole basis for treatment or other patient management decisions. Negative results must be combined with clinical observations, patient history, and epidemiological information. The expected result is Negative.  Fact Sheet for Patients: 07/12/20  Fact Sheet for Healthcare Providers: HairSlick.no  This test is not yet approved or cleared by the quierodirigir.com FDA and  has been authorized for detection and/or diagnosis of SARS-CoV-2  by FDA under an Emergency Use Authorization (EUA). This EUA will remain  in effect (meaning this test can be used) for the duration of the COVID-19 declaration under Se ction 564(b)(1) of the Act, 21 U.S.C. section 360bbb-3(b)(1), unless the authorization is terminated or revoked sooner.  Performed at [facility], [address] [place], [place]          Radiology Studies: CT Head Wo Contrast  Result Date: 07/10/2020 CLINICAL DATA:  Head trauma, moderate/severe. EXAM: CT HEAD WITHOUT CONTRAST TECHNIQUE: Contiguous axial images were obtained from the base of the skull through the vertex without intravenous contrast. COMPARISON:  Report from noncontrast head CT 01/25/2002 (images unavailable). FINDINGS: Brain: Streak and beam hardening artifact arising from aneurysm clips in the right paraclinoid region and along the course of the M2/M3 right middle cerebral artery branches limits evaluation. A right parietal approach  ventricular catheter terminates just the right of midline near the right foramen of Monro. No evidence of hydrocephalus Cerebral volume is normal. Mild ill-defined hypoattenuation within the cerebral white matter is nonspecific, but compatible with chronic small vessel ischemic disease. No evidence of acute intracranial hemorrhage or acute demarcated cortical infarction. No extra-axial fluid collection. No evidence of intracranial mass. No midline shift. Vascular: Atherosclerotic calcifications. Vascular prominence in the region of the distal M1 left middle cerebral artery measuring 6 mm (series 2, image 9). 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 Skull: Calvarial fracture.  Right-sided cranioplasty. Sinuses/Orbits: Visualized orbits show no acute finding. No significant paranasal sinus disease at the imaged levels. IMPRESSION: Examination limited by streak and beam hardening artifact arising from multiple aneurysm clips. No evidence of acute intracranial  hemorrhage or acute infarction. 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 CT angiography of the head is recommended for further evaluation. Right parietal approach ventricular catheter terminating just to the right of midline near the foramen of Monro. No evidence of hydrocephalus. Mild cerebral white matter chronic small vessel ischemic disease. Electronically Signed   By: Jackey Loge DO   On: 07/10/2020 11:53   DG Chest Portable 1 View  Result Date: 07/10/2020 CLINICAL DATA:  Chest pain. EXAM: PORTABLE CHEST 1 VIEW COMPARISON:  05/29/2008. FINDINGS: The heart size and mediastinal contours are within normal limits. Both lungs are clear. No visible pleural effusions or pneumothorax. No acute osseous abnormality. Partially imaged VP shunt coursing along the right neck and chest. IMPRESSION: No active disease. Electronically Signed   By: Feliberto Harts MD   On: 07/10/2020 11:24        Scheduled Meds: . amiodarone  400 mg Oral Daily  . apixaban  5 mg Oral BID  . [START ON 07/12/2020] aspirin  81 mg Oral Daily  . fenofibrate  160 mg Oral Daily  . rosuvastatin  40 mg Oral QHS   Continuous Infusions:   LOS: 0 days    Time spent:  Zannie Cove, MD [facility]  07/11/2020, 11:23 AM

## 2020-07-11 NOTE — Discharge Instructions (Signed)
Information on my medicine - ELIQUIS (apixaban)  This medication education was reviewed with me or my healthcare representative as part of my discharge preparation.     Why was Eliquis prescribed for you? Eliquis was prescribed for you to reduce the risk of a blood clot forming that can cause a stroke if you have a medical condition called atrial fibrillation (a type of irregular heartbeat).  What do You need to know about Eliquis ? Take your Eliquis TWICE DAILY - one tablet in the morning and one tablet in the evening with or without food. If you have difficulty swallowing the tablet whole please discuss with your pharmacist how to take the medication safely.  Take Eliquis exactly as prescribed by your doctor and DO NOT stop taking Eliquis without talking to the doctor who prescribed the medication.  Stopping may increase your risk of developing a stroke.  Refill your prescription before you run out.  After discharge, you should have regular check-up appointments with your healthcare provider that is prescribing your Eliquis.  In the future your dose may need to be changed if your kidney function or weight changes by a significant amount or as you get older.  What do you do if you miss a dose? If you miss a dose, take it as soon as you remember on the same day and resume taking twice daily.  Do not take more than one dose of ELIQUIS at the same time to make up a missed dose.  Important Safety Information A possible side effect of Eliquis is bleeding. You should call your healthcare provider right away if you experience any of the following: ? Bleeding from an injury or your nose that does not stop. ? Unusual colored urine (red or dark brown) or unusual colored stools (red or black). ? Unusual bruising for unknown reasons. ? A serious fall or if you hit your head (even if there is no bleeding).  Some medicines may interact with Eliquis and might increase your risk of bleeding or  clotting while on Eliquis. To help avoid this, consult your healthcare provider or pharmacist prior to using any new prescription or non-prescription medications, including herbals, vitamins, non-steroidal anti-inflammatory drugs (NSAIDs) and supplements.  This website has more information on Eliquis (apixaban): http://www.eliquis.com/eliquis/home  =====================================================  Atrial Fibrillation    Atrial fibrillation is a type of heartbeat that is irregular or fast. If you have this condition, your heart beats without any order. This makes it hard for your heart to pump blood in a normal way. Atrial fibrillation may come and go, or it may become a long-lasting problem. If this condition is not treated, it can put you at higher risk for stroke, heart failure, and other heart problems.  What are the causes? This condition may be caused by diseases that damage the heart. They include:  High blood pressure.  Heart failure.  Heart valve disease.  Heart surgery. Other causes include:  Diabetes.  Thyroid disease.  Being overweight.  Kidney disease. Sometimes the cause is not known.  What increases the risk? You are more likely to develop this condition if:  You are older.  You smoke.  You exercise often and very hard.  You have a family history of this condition.  You are a man.  You use drugs.  You drink a lot of alcohol.  You have lung conditions, such as emphysema, pneumonia, or COPD.  You have sleep apnea.   What are the signs or symptoms? Common symptoms   of this condition include:  A feeling that your heart is beating very fast.  Chest pain or discomfort.  Feeling short of breath.  Suddenly feeling light-headed or weak.  Getting tired easily during activity.  Fainting.  Sweating. In some cases, there are no symptoms.  How is this treated? Treatment for this condition depends on underlying conditions and how you feel  when you have atrial fibrillation. They include: 1. Medicines to: ? Prevent blood clots. ? Treat heart rate or heart rhythm problems. 2. Using devices, such as a pacemaker, to correct heart rhythm problems. 3. Doing surgery to remove the part of the heart that sends bad signals. 4. Closing an area where clots can form in the heart (left atrial appendage). In some cases, your doctor will treat other underlying conditions.  Follow these instructions at home:  Medicines 1. Take over-the-counter and prescription medicines only as told by your doctor. 2. Do not take any new medicines without first talking to your doctor. 3. If you are taking blood thinners: ? Talk with your doctor before you take any medicines that have aspirin or NSAIDs, such as ibuprofen, in them. ? Take your medicine exactly as told by your doctor. Take it at the same time each day. ? Avoid activities that could hurt or bruise you. Follow instructions about how to prevent falls. ? Wear a bracelet that says you are taking blood thinners. Or, carry a card that lists what medicines you take. Lifestyle          Do not use any products that have nicotine or tobacco in them. These include cigarettes, e-cigarettes, and chewing tobacco. If you need help quitting, ask your doctor.  Eat heart-healthy foods. Talk with your doctor about the right eating plan for you.  Exercise regularly as told by your doctor.  Do not drink alcohol.  Lose weight if you are overweight.  Do not use drugs, including cannabis.  General instructions  If you have a condition that causes breathing to stop for a short period of time (apnea), treat it as told by your doctor.  Keep a healthy weight. Do not use diet pills unless your doctor says they are safe for you. Diet pills may make heart problems worse.  Keep all follow-up visits as told by your doctor. This is important.  Contact a doctor if:  You notice a change in the speed, rhythm,  or strength of your heartbeat.  You are taking a blood-thinning medicine and you get more bruising.  You get tired more easily when you move or exercise.  You have a sudden change in weight.  Get help right away if:    1. You have pain in your chest or your belly (abdomen). 2. You have trouble breathing. 3. You have side effects of blood thinners, such as blood in your vomit, poop (stool), or pee (urine), or bleeding that cannot stop. 4. You have any signs of a stroke. "BE FAST" is an easy way to remember the main warning signs: ? B - Balance. Signs are dizziness, sudden trouble walking, or loss of balance. ? E - Eyes. Signs are trouble seeing or a change in how you see. ? F - Face. Signs are sudden weakness or loss of feeling in the face, or the face or eyelid drooping on one side. ? A - Arms. Signs are weakness or loss of feeling in an arm. This happens suddenly and usually on one side of the body. ? S - Speech.   Signs are sudden trouble speaking, slurred speech, or trouble understanding what people say. ? T - Time. Time to call emergency services. Write down what time symptoms started. 5. You have other signs of a stroke, such as: ? A sudden, very bad headache with no known cause. ? Feeling like you may vomit (nausea). ? Vomiting. ? A seizure.  These symptoms may be an emergency. Do not wait to see if the symptoms will go away. Get medical help right away. Call your local emergency services (911 in the [place] Do not drive yourself to the hospital. Summary  Atrial fibrillation is a type of heartbeat that is irregular or fast.  You are at higher risk of this condition if you smoke, are older, have diabetes, or are overweight.  Follow your doctor's instructions about medicines, diet, exercise, and follow-up visits.  Get help right away if you have signs or symptoms of a stroke.  Get help right away if you cannot catch your breath, or you have chest pain or discomfort. This  information is not intended to replace advice given to you by your health care provider. Make sure you discuss any questions you have with your health care provider. Document Revised: 11/01/2018 Document Reviewed: 11/01/2018 Elsevier Patient Education  2020 Elsevier Inc.     

## 2020-07-11 NOTE — Progress Notes (Signed)
Heart Failure Nurse Navigator Progress Note  PCP: Patient, No Pcp Per PCP-Cardiologist: Elonda Husky [facility]) Admission Diagnosis: chest pain Admitted from: home with partner  Presentation:   Otho Najjar presented with chest pressure and syncope.  ECHO/ LVEF: 60-65%, G2DD.  Clinical Course:  Past Medical History:  Diagnosis Date  . Hypertension      Social History   Socioeconomic History  . Marital status: Single    Spouse name: Not on file  . Number of children: Not on file  . Years of education: Not on file  . Highest education level: Not on file  Occupational History  . Not on file  Tobacco Use  . Smoking status: Current Every Day Smoker  . Smokeless tobacco: Never Used  Vaping Use  . Vaping Use: Never used  Substance and Sexual Activity  . Alcohol use: Never  . Drug use: Yes    Types: Marijuana    Comment: yesterday  . Sexual activity: Not on file  Other Topics Concern  . Not on file  Social History Narrative  . Not on file   Social Determinants of Health   Financial Resource Strain: High Risk  . Difficulty of Paying Living Expenses: Very hard  Food Insecurity: Food Insecurity Present  . Worried About Programme researcher, broadcasting/film/video in the Last Year: Sometimes true  . Ran Out of Food in the Last Year: Sometimes true  Transportation Needs: No Transportation Needs  . Lack of Transportation (Medical): No  . Lack of Transportation (Non-Medical): No  Physical Activity: Not on file  Stress: Not on file  Social Connections: Not on file    High Risk Criteria for Readmission and/or Poor Patient Outcomes:  Heart failure hospital admissions (last 6 months): 1   No Show rate: N/A  Difficult social situation: yes  Demonstrates medication adherence: no  Primary Language: English  Literacy level: able to read/write and comprehend. Concerned about ability for safe self care.  Barriers of Care:   -no insurance -financial insecurity -medication  cost  Considerations/Referrals:   Referral made to Heart Failure Pharmacist Stewardship: yes, to see at [facility] TOC appt. Referral made to Heart & Vascular TOC clinic: yes, appt Friday 2/25 @ 1pm  Items for Follow-up on DC/TOC: -insurance-asked about early medicare ? -financial issues-no income -medication cost -med compliance  Ozella Rocks, RN, BSN Heart Failure Nurse Navigator [phone]

## 2020-07-11 NOTE — Progress Notes (Signed)
Pt's HR went up to 160-170's, did ECG, showed Afib RVR, on call MD showed up right after the ECG was done.  MD ordered Amiodarone IV, Heparin IV, and IV Potassium due to K 3.0.  In about 30 minutes before starting the Amiodarone, pt went back into NSR, ECG confirmed it.  Held back on the Amiodarone.  15 minutes later pt was back in Afib RVR sustaining in the 150-160's.  Started the Amiodarone per MD order, 15 minutes later she converted back to NSR, in the 60-70's, will continue to monitor, Thanks Lavonda Jumbo RN.

## 2020-07-11 NOTE — Progress Notes (Signed)
Echocardiogram 2D Echocardiogram has been performed.  Cassandra Fernandez 07/11/2020, 9:31 AM

## 2020-07-11 NOTE — Evaluation (Signed)
Physical Therapy Evaluation Patient Details Name: Cassandra Fernandez MRN: [id number] DOB: June 19, 1960 Today's Date: 07/11/2020   History of Present Illness  Patient is a 60 y/o female who presents with chest pain/pressure, nausea, diaphoresis and syncopal attack. Found to have a new onset of A-fib with RVR. PMH includes HTN, CABG and revision bioprosthetic AVR 2020, AAA with prior repair in 2013, ascending aortic aneurysm repair as well.  Clinical Impression  Patient presents with orthostatic hypotension impacting overall safe mobility. Pt lives at home with long time partner and reports being independent for ADLs/IADLs and walking at baseline. Reports hx of falls and does endorse when she gets up too fast, she needs to lean on something to feel better/more stable. Today, pt tolerated transfers and gait training with supervision for safety. Pt with positive orthostatic vitals, see below:  Supine BP 141/96  HR 70 bpm Sitting BP 123/87  HR 73 bpm Standing BP 113/78  HR 80 bpm Sitting BP post activity 124/99  HR 91 bpm  Pt symptomatic after standing for 1-2 minutes and not able to tolerate continued standing for 3 minute standing BP due to not feeling well. Encouraged walking to bathroom with nursing as able instead of using BSC. Discussed safety when moving at home and waiting a few mins after change in position to minimize fall risk. Will follow acutely to maximize independence and mobility prior to return home.     Follow Up Recommendations No PT follow up    Equipment Recommendations  None recommended by PT    Recommendations for Other Services       Precautions / Restrictions Precautions Precautions: Other (comment);Fall Precaution Comments: orthostatic hypotension Restrictions Weight Bearing Restrictions: No      Mobility  Bed Mobility Overal bed mobility: Modified Independent             General bed mobility comments: Performed x3 without difficulty getting into long sitting and  to EOB without assist or rails.    Transfers Overall transfer level: Needs assistance Equipment used: None Transfers: Sit to/from [employer] Sit to Stand: Supervision Stand pivot transfers: Supervision       General transfer comment: Supervision for safety. Stood from EOB x4, no dizziness initially. Transferred bed to/from [facility] with supervision.  Ambulation/Gait Ambulation/Gait assistance: Supervision Gait Distance (Feet): 120 Feet Assistive device: None Gait Pattern/deviations: Step-through pattern;Decreased stride length Gait velocity: decreased Gait velocity interpretation: 1.31 - 2.62 ft/sec, indicative of limited community ambulator General Gait Details: Slow, steady gait with no evidence of imbalance/safety  Stairs            Wheelchair Mobility    Modified Rankin (Stroke Patients Only)       Balance Overall balance assessment: Mild deficits observed, not formally tested                                           Pertinent Vitals/Pain Pain Assessment: Faces Faces Pain Scale: Hurts little more Pain Location: BLEs Pain Descriptors / Indicators: Aching Pain Intervention(s): Monitored during session;Repositioned    Home Living Family/patient expects to be discharged to:: Private residence Living Arrangements: Spouse/significant other Available Help at Discharge: Family;Available 24 hours/day Type of Home: Mobile home Home Access: Ramped entrance     Home Layout: One level        Prior Function Level of Independence: Independent         Comments:  Independent for ADLs/IADLs, drives. Reports 1 fall walking into the vet recently.     Hand Dominance   Dominant Hand: Right    Extremity/Trunk Assessment   Upper Extremity Assessment Upper Extremity Assessment: Defer to OT evaluation    Lower Extremity Assessment Lower Extremity Assessment: Overall WFL for tasks assessed    Cervical / Trunk  Assessment Cervical / Trunk Assessment: Normal  Communication   Communication: No difficulties  Cognition Arousal/Alertness: Awake/alert Behavior During Therapy: WFL for tasks assessed/performed Overall Cognitive Status: Within Functional Limits for tasks assessed                                        General Comments      Exercises     Assessment/Plan    PT Assessment Patient needs continued PT services  PT Problem List Decreased mobility;Cardiopulmonary status limiting activity;Decreased activity tolerance       PT Treatment Interventions Therapeutic exercise;Gait training;Therapeutic activities;Patient/family education    PT Goals (Current goals can be found in the Care Plan section)  Acute Rehab PT Goals Patient Stated Goal: to get some rest PT Goal Formulation: With patient Time For Goal Achievement: 07/25/20 Potential to Achieve Goals: Good    Frequency Min 3X/week   Barriers to discharge        Co-evaluation               AM-PAC PT "6 Clicks" Mobility  Outcome Measure Help needed turning from your back to your side while in a flat bed without using bedrails?: None Help needed moving from lying on your back to sitting on the side of a flat bed without using bedrails?: None Help needed moving to and from a bed to a chair (including a wheelchair)?: None Help needed standing up from a chair using your arms (e.g., wheelchair or bedside chair)?: None Help needed to walk in hospital room?: None Help needed climbing 3-5 steps with a railing? : A Little 6 Click Score: 23    End of Session Equipment Utilized During Treatment: Gait belt Activity Tolerance: Patient tolerated treatment well;Treatment limited secondary to medical complications (Comment) (orthostatic hypotension) Patient left: in bed;with call bell/phone within reach Nurse Communication: Mobility status;Other (comment) (orthostatic BP) PT Visit Diagnosis: History of falling  (Z91.81);Difficulty in walking, not elsewhere classified (R26.2)    Time: 7106-2694 PT Time Calculation (min) (ACUTE ONLY): 24 min   Charges:   PT Evaluation $PT Eval Moderate Complexity: 1 Mod PT Treatments $Gait Training: 8-22 mins        Vale Haven, PT, DPT Acute Rehabilitation Services Pager [phone] Office [phone]      Cassandra Fernandez 07/11/2020, 10:48 AM

## 2020-07-11 NOTE — Progress Notes (Signed)
Patient Advocate Encounter  Application for [employer] Patient Assistance Program has been initiated for Eliquis in an effort to bring the copay down to $0 per month. Patient has completed her portion of the application. Outpatient follow up has not been clearly defined yet. Will hold off on completing provider portion of application until determination is made that she will just return to PCP vs see cardiology here. PCP is through [facility].   Will continue to follow.  Once application is completed, will fax to [phone]. [employer] phone number for follow up calls is [phone].  Sharen Hones, PharmD, BCPS Heart Failure Stewardship Pharmacist Phone [phone]  Please check [email or web address] for unit-specific pharmacist phone numbers

## 2020-07-11 NOTE — Progress Notes (Signed)
ANTICOAGULATION CONSULT NOTE - Initial Consult  Pharmacy Consult for heparin Indication: atrial fibrillation  Allergies  Allergen Reactions  . Morphine And Related Itching    Patient Measurements: Height: 5\' 6"  (167.6 cm) Weight: 71.8 kg (158 lb 6.4 oz) IBW/kg (Calculated) : 59.3  Vital Signs: Temp: 98.7 F (37.1 C) (02/18 0009) Temp Source: Oral (02/18 0009) BP: 149/95 (02/18 0100) Pulse Rate: 156 (02/18 0104)  Labs: Recent Labs    07/10/20 1122 07/10/20 1324 07/10/20 1539 07/10/20 2340  HGB 14.7  --   --   --   HCT 43.4  --   --   --   PLT 194  --   --   --   APTT 27  --   --   --   LABPROT 12.9  --   --   --   INR 1.0  --   --   --   CREATININE 1.45*  --   --  1.20*  TROPONINIHS 19* 38* 60* 81*    Estimated Creatinine Clearance: 50 mL/min (A) (by C-G formula based on SCr of 1.2 mg/dL (H)).   Medical History: Past Medical History:  Diagnosis Date  . Hypertension     Medications:  Medications Prior to Admission  Medication Sig Dispense Refill Last Dose  . amLODipine (NORVASC) 10 MG tablet Take by mouth.     07/12/20 atorvastatin (LIPITOR) 80 MG tablet Take by mouth.     . clopidogrel (PLAVIX) 75 MG tablet Take 1 tablet by mouth daily.     . furosemide (LASIX) 40 MG tablet Take by mouth.     Marland Kitchen lisinopril (ZESTRIL) 5 MG tablet Take by mouth.     . metoprolol tartrate (LOPRESSOR) 50 MG tablet Take 1 tablet by mouth 2 (two) times daily.     Marland Kitchen gemfibrozil (LOPID) 600 MG tablet Take by mouth.      Scheduled:  . atorvastatin  80 mg Oral q1800  . clopidogrel  75 mg Oral Daily  . gemfibrozil  600 mg Oral BID AC  . metoprolol tartrate  50 mg Oral BID   Infusions:  . sodium chloride 100 mL/hr at 07/11/20 0123  . potassium chloride      Assessment: 60yo female c/o CP associated w/ nausea and diaphoresis, initially worked up for ACS but suspicion for ischemic heart dz low after CABG in 2020, later found to be in Afib w/ RVR, to begin heparin.  Goal of Therapy:   Heparin level 0.3-0.7 units/ml Monitor platelets by anticoagulation protocol: Yes   Plan:  Will give heparin 4000 units IV bolus x1 followed by gtt at 1100 units/hr and monitor heparin levels and CBC.  2021, PharmD, BCPS  07/11/2020,1:31 AM

## 2020-07-11 NOTE — Evaluation (Signed)
Occupational Therapy Evaluation and Discharge Patient Details Name: Cassandra Fernandez MRN: [id number] DOB: 1960/12/10 Today's Date: 07/11/2020    History of Present Illness Patient is a 60 y/o female who presents with chest pain/pressure, nausea, diaphoresis and syncopal attack. Found to have a new onset of A-fib with RVR. PMH includes HTN, CABG and revision bioprosthetic AVR 2020, AAA with prior repair in 2013, ascending aortic aneurysm repair as well.   Clinical Impression   This 60 yo female admitted with above presents to acute OT with PLOF of being totally independent with basic ADLs. IADLs, and driving (says he will probably give this up now due to passing out). Currently she is back to her baseline within the hospital environment for basic ADLs and is aware she needs to take her time when she changes positions. I recommended she might want to consider compression socks to help with BP but she states she does not like tight things on her legs--so I did not ask MD about these for her. No further OT needs, we will sign off.    Follow Up Recommendations  No OT follow up;Supervision - Intermittent    Equipment Recommendations  None recommended by OT       Precautions / Restrictions Precautions Precautions: Fall Precaution Comments: orthostatic hypotension (she did have a slight drop with me from supine to sitting (5 points systolic) but was not symptomatic Restrictions Weight Bearing Restrictions: No      Mobility Bed Mobility Overal bed mobility: Independent                  Transfers Overall transfer level: Independent Equipment used: None Transfers: Sit to/from Stand Sit to Stand: Independent         General transfer comment: ambulating to bathroom, using bathroom, back to sit EOB--Independent    Balance Overall balance assessment: Mild deficits observed, not formally tested (only on way back from bathroom)                                          ADL either performed or assessed with clinical judgement   ADL Overall ADL's : Independent                                             Vision Patient Visual Report: No change from baseline              Pertinent Vitals/Pain Pain Assessment: Faces Faces Pain Scale: Hurts a little bit Pain Location: Bil UEs (shoudlers) from fall (stated when she fell she braced herself with Bil UEs--hands and that jolted her shoulders), hands are bruised Pain Intervention(s): Limited activity within patient's tolerance;Monitored during session     Hand Dominance Right   Extremity/Trunk Assessment Upper Extremity Assessment Upper Extremity Assessment: Overall WFL for tasks assessed           Communication Communication Communication: No difficulties   Cognition Arousal/Alertness: Awake/alert Behavior During Therapy: WFL for tasks assessed/performed Overall Cognitive Status: Within Functional Limits for tasks assessed  Home Living Family/patient expects to be discharged to:: Private residence Living Arrangements: Spouse/significant other Available Help at Discharge: Family;Available 24 hours/day Type of Home: Mobile home Home Access: Ramped entrance     Home Layout: One level     Bathroom Shower/Tub: Chief Strategy Officer: Handicapped height     Home Equipment: None          Prior Functioning/Environment Level of Independence: Independent        Comments: Independent for ADLs/IADLs, drives. Reports 1 fall walking into the vet recently.        OT Problem List: Impaired balance (sitting and/or standing)      OT Treatment/Interventions: Self-care/ADL training    OT Goals(Current goals can be found in the care plan section) Acute Rehab OT Goals Patient Stated Goal: to go home  OT Frequency: Min 2X/week              AM-PAC OT "6 Clicks" Daily Activity      Outcome Measure Help from another person eating meals?: None Help from another person taking care of personal grooming?: None Help from another person toileting, which includes using toliet, bedpan, or urinal?: None Help from another person bathing (including washing, rinsing, drying)?: None Help from another person to put on and taking off regular upper body clothing?: None Help from another person to put on and taking off regular lower body clothing?: None 6 Click Score: 24   End of Session Nurse Communication:  (pt wanted multiple items to drink -RN ok'd this)  Activity Tolerance: Patient tolerated treatment well Patient left: in bed;with call bell/phone within reach  OT Visit Diagnosis: History of falling (Z91.81)                Time: 1610-9604 OT Time Calculation (min): 20 min Charges:  OT General Charges $OT Visit: 1 Visit OT Evaluation $OT Eval Moderate Complexity: 1 Mod  Ignacia Palma, OTR/L Acute [employer] Pager [phone] Office [phone]     Cassandra Fernandez 07/11/2020, 4:39 PM

## 2020-07-11 NOTE — Progress Notes (Addendum)
DAILY PROGRESS NOTE   Patient Name: Cassandra Fernandez Date of Encounter: 07/11/2020 Cardiologist: No primary care provider on file.  Chief Complaint   Feels better today, no chest pain  Patient Profile   Cassandra Fernandez is a 60 y.o. female with a history of coronary artery bypass grafting and revision bioprosthetic aortic valve replacement 2020, abdominal aortic aneurysm with prior repair in 2013, ascending aortic aneurysm repair at the time of aortic root replacement in 2012 on whom we are consulted for atrial fibrillation.  Subjective   Noted to have afib with RVR overnight, now in sinus in the 60-70's on amiodarone. Potassium very low today at 2.9. Magnesium 1.8. BNP elevated at 515. trops flat in the 60-80 range. Echo personally reviewed, LVEF is grossly normal, there is diastolic dysfunction, left atrial enlargement and stable bioprosthetic aortic valve gradient. She is apparently struggling with orthostatic hypotension.  Objective   Vitals:   07/11/20 0104 07/11/20 0147 07/11/20 0448 07/11/20 0717  BP:  (!) 138/91 131/71 (!) 130/113  Pulse: (!) 156 69 74 68  Resp: 18   16  Temp:   98.1 F (36.7 C) 97.9 F (36.6 C)  TempSrc:   Oral Oral  SpO2:   96% 96%  Weight:   72.1 kg   Height:        Intake/Output Summary (Last 24 hours) at 07/11/2020 0935 Last data filed at 07/11/2020 0830 Gross per 24 hour  Intake 1868.4 ml  Output 900 ml  Net 968.4 ml   Filed Weights   07/10/20 1036 07/10/20 2159 07/11/20 0448  Weight: 71.9 kg 71.8 kg 72.1 kg    Physical Exam   General appearance: alert and no distress Neck: no carotid bruit, no JVD and thyroid not enlarged, symmetric, no tenderness/mass/nodules Lungs: clear to auscultation bilaterally Heart: regular rate and rhythm Abdomen: soft, non-tender; bowel sounds normal; no masses,  no organomegaly Extremities: extremities normal, atraumatic, no cyanosis or edema Pulses: 2+ and symmetric Skin: Skin color, texture, turgor normal. No  rashes or lesions Neurologic: Grossly normal Psych: Pleasant  Inpatient Medications    Scheduled Meds: . atorvastatin  80 mg Oral q1800  . clopidogrel  75 mg Oral Daily  . gemfibrozil  600 mg Oral BID AC  . potassium chloride  40 mEq Oral QID    Continuous Infusions: . sodium chloride 100 mL/hr at 07/11/20 0801  . amiodarone 30 mg/hr (07/11/20 0801)  . heparin 1,100 Units/hr (07/11/20 0933)    PRN Meds: acetaminophen **OR** acetaminophen   Labs   Results for orders placed or performed during the hospital encounter of 07/10/20 (from the past 48 hour(s))  Basic metabolic panel     Status: Abnormal   Collection Time: 07/10/20 11:22 AM  Result Value Ref Range   Sodium 142 135 - 145 mmol/L   Potassium 2.9 (L) 3.5 - 5.1 mmol/L   Chloride 104 98 - 111 mmol/L   CO2 26 22 - 32 mmol/L   Glucose, Bld 188 (H) 70 - 99 mg/dL    Comment: Glucose reference range applies only to samples taken after fasting for at least 8 hours.   BUN 16 8 - 23 mg/dL   Creatinine, Ser 9.93 (H) 0.44 - 1.00 mg/dL   Calcium 9.8 8.9 - 71.6 mg/dL   GFR, Estimated 41 (L) >60 mL/min    Comment: (NOTE) Calculated using the CKD-EPI Creatinine Equation (2021)    Anion gap 12 5 - 15    Comment: Performed at [facility],  [address] [place], [place] [address]  Troponin I (High Sensitivity)     Status: Abnormal   Collection Time: 07/10/20 11:22 AM  Result Value Ref Range   Troponin I (High Sensitivity) 19 (H) <18 ng/L    Comment: (NOTE) Elevated high sensitivity troponin I (hsTnI) values and significant  changes across serial measurements may suggest ACS but many other  chronic and acute conditions are known to elevate hsTnI results.  Refer to the "Links" section for chest pain algorithms and additional  guidance. Performed at [facility] [place], [address] [place], [place] [address]   CBC with Differential/Platelet     Status: None   Collection Time: 07/10/20 11:22 AM  Result  Value Ref Range   WBC 7.8 4.0 - 10.5 K/uL   RBC 5.04 3.87 - 5.11 MIL/uL   Hemoglobin 14.7 12.0 - 15.0 g/dL   HCT 09.843.4 11.936.0 - 14.746.0 %   MCV 86.1 80.0 - 100.0 fL   MCH 29.2 26.0 - 34.0 pg   MCHC 33.9 30.0 - 36.0 g/dL   RDW 82.913.9 56.211.5 - 13.015.5 %   Platelets 194 150 - 400 K/uL   nRBC 0.0 0.0 - 0.2 %   Neutrophils Relative % 83 %   Neutro Abs 6.5 1.7 - 7.7 K/uL   Lymphocytes Relative 12 %   Lymphs Abs 0.9 0.7 - 4.0 K/uL   Monocytes Relative 4 %   Monocytes Absolute 0.3 0.1 - 1.0 K/uL   Eosinophils Relative 0 %   Eosinophils Absolute 0.0 0.0 - 0.5 K/uL   Basophils Relative 1 %   Basophils Absolute 0.1 0.0 - 0.1 K/uL   Immature Granulocytes 0 %   Abs Immature Granulocytes 0.02 0.00 - 0.07 K/uL    Comment: Performed at [facility] [place], [address] [place], [place] [address]  Magnesium     Status: None   Collection Time: 07/10/20 11:22 AM  Result Value Ref Range   Magnesium 1.9 1.7 - 2.4 mg/dL    Comment: Performed at [facility] [place], [address] [place], [place] [address]  Protime-INR     Status: None   Collection Time: 07/10/20 11:22 AM  Result Value Ref Range   Prothrombin Time 12.9 11.4 - 15.2 seconds   INR 1.0 0.8 - 1.2    Comment: (NOTE) INR goal varies based on device and disease states. Performed at [facility] [place], [address] [place], [place] [address]   APTT     Status: None   Collection Time: 07/10/20 11:22 AM  Result Value Ref Range   aPTT 27 24 - 36 seconds    Comment: Performed at [facility] [place], [address] [place], [place] [address]  SARS CORONAVIRUS 2 (TAT 6-24 HRS)     Status: None   Collection Time: 07/10/20 11:51 AM  Result Value Ref Range   SARS Coronavirus 2 NEGATIVE NEGATIVE    Comment: (NOTE) SARS-CoV-2 target nucleic acids are NOT DETECTED.  The SARS-CoV-2 RNA is generally detectable in upper and lower respiratory specimens during the acute phase of infection. Negative results do not preclude SARS-CoV-2  infection, do not rule out co-infections with other pathogens, and should not be used as the sole basis for treatment or other patient management decisions. Negative results must be combined with clinical observations, patient history, and epidemiological information. The expected result is Negative.  Fact Sheet for Patients: HairSlick.nohttps://www.fda.gov/media/138098/download  Fact Sheet for Healthcare  Providers: quierodirigir.com  This test is not yet approved or cleared by the [place] and  has been authorized for detection and/or diagnosis of SARS-CoV-2 by FDA under an Emergency Use Authorization (EUA). This EUA will remain  in effect (meaning this test can be used) for the duration of the COVID-19 declaration under Se ction 564(b)(1) of the Act, 21 U.S.C. section 360bbb-3(b)(1), unless the authorization is terminated or revoked sooner.  Performed at [facility], [address] [place], [place] [address]   Troponin I (High Sensitivity)     Status: Abnormal   Collection Time: 07/10/20  1:24 PM  Result Value Ref Range   Troponin I (High Sensitivity) 38 (H) <18 ng/L    Comment: (NOTE) Elevated high sensitivity troponin I (hsTnI) values and significant  changes across serial measurements may suggest ACS but many other  chronic and acute conditions are known to elevate hsTnI results.  Refer to the "Links" section for chest pain algorithms and additional  guidance. Performed at [facility], [address] [place], [place] [address]   Troponin I (High Sensitivity)     Status: Abnormal   Collection Time: 07/10/20  3:39 PM  Result Value Ref Range   Troponin I (High Sensitivity) 60 (H) <18 ng/L    Comment: CRITICAL RESULT CALLED TO, READ BACK BY AND VERIFIED WITH: CRYSTAL ADKINS RN @1638  07/10/2020 OLSONM DELTA CHECK NOTED (NOTE) Elevated high sensitivity troponin I (hsTnI) values and significant  changes across serial measurements  may suggest ACS but many other  chronic and acute conditions are known to elevate hsTnI results.  Refer to the Links section for chest pain algorithms and additional  guidance. Performed at [facility], [address] [place], [place]   Urinalysis, Routine w reflex microscopic Urine, Random     Status: Abnormal   Collection Time: 07/10/20 11:04 PM  Result Value Ref Range   Color, Urine YELLOW YELLOW   APPearance HAZY (A) CLEAR   Specific Gravity, Urine 1.019 1.005 - 1.030   pH 5.0 5.0 - 8.0   Glucose, UA NEGATIVE NEGATIVE mg/dL   Hgb urine dipstick NEGATIVE NEGATIVE   Bilirubin Urine NEGATIVE NEGATIVE   Ketones, ur NEGATIVE NEGATIVE mg/dL   Protein, ur NEGATIVE NEGATIVE mg/dL   Nitrite NEGATIVE NEGATIVE   Leukocytes,Ua NEGATIVE NEGATIVE    Comment: Performed at [facility], [address], [place], [place]  Sodium, urine, random     Status: None   Collection Time: 07/10/20 11:04 PM  Result Value Ref Range   Sodium, Ur 11 mmol/L    Comment: Performed at [facility], [address], [place], [place]  Creatinine, urine, random     Status: None   Collection Time: 07/10/20 11:04 PM  Result Value Ref Range   Creatinine, Urine 276.06 mg/dL    Comment: Performed at [facility], [address], [place], [place]  Urine rapid drug screen (hosp performed)     Status: Abnormal   Collection Time: 07/10/20 11:06 PM  Result Value Ref Range   Opiates NONE DETECTED NONE DETECTED   Cocaine NONE DETECTED NONE DETECTED   Benzodiazepines NONE DETECTED NONE DETECTED   Amphetamines NONE DETECTED NONE DETECTED   Tetrahydrocannabinol POSITIVE (A) NONE DETECTED   Barbiturates NONE DETECTED NONE DETECTED    Comment: (NOTE) DRUG SCREEN FOR MEDICAL PURPOSES ONLY.  IF CONFIRMATION IS NEEDED FOR ANY PURPOSE, NOTIFY LAB  WITHIN 5 DAYS.  LOWEST DETECTABLE LIMITS FOR URINE DRUG SCREEN Drug Class                     Cutoff  (ng/mL) Amphetamine and metabolites    1000 Barbiturate and metabolites    200 Benzodiazepine                 200 Tricyclics and metabolites     300 Opiates and metabolites        300 Cocaine and metabolites        300 THC                            50 Performed at [facility], [address], [place], [place] [address]   Troponin I (High Sensitivity)     Status: Abnormal   Collection Time: 07/10/20 11:40 PM  Result Value Ref Range   Troponin I (High Sensitivity) 81 (H) <18 ng/L    Comment: (NOTE) Elevated high sensitivity troponin I (hsTnI) values and significant  changes across serial measurements may suggest ACS but many other  chronic and acute conditions are known to elevate hsTnI results.  Refer to the "Links" section for chest pain algorithms and additional  guidance. Performed at [facility], [address] [place], [place] [address]   D-dimer, quantitative (not at [facility])     Status: Abnormal   Collection Time: 07/10/20 11:40 PM  Result Value Ref Range   D-Dimer, Quant 3.39 (H) 0.00 - 0.50 ug/mL-FEU    Comment: (NOTE) At the manufacturer cut-off value of 0.5 g/mL FEU, this assay has a negative predictive value of 95-100%.This assay is intended for use in conjunction with a clinical pretest probability (PTP) assessment model to exclude pulmonary embolism (PE) and deep venous thrombosis (DVT) in outpatients suspected of PE or DVT. Results should be correlated with clinical presentation. Performed at [facility], [address], [place], [place] [address]   Comprehensive metabolic panel     Status: Abnormal   Collection Time: 07/10/20 11:40 PM  Result Value Ref Range   Sodium 143 135 - 145 mmol/L   Potassium 3.0 (L) 3.5 - 5.1 mmol/L   Chloride 106 98 - 111 mmol/L   CO2 26 22 - 32 mmol/L   Glucose, Bld 175 (H) 70 - 99 mg/dL    Comment: Glucose reference range applies only to samples taken after fasting for at least 8 hours.   BUN 15 8 - 23  mg/dL   Creatinine, Ser 6.57 (H) 0.44 - 1.00 mg/dL   Calcium 9.8 8.9 - 84.6 mg/dL   Total Protein 6.2 (L) 6.5 - 8.1 g/dL   Albumin 3.7 3.5 - 5.0 g/dL   AST 15 15 - 41 U/L   ALT 10 0 - 44 U/L   Alkaline Phosphatase 92 38 - 126 U/L   Total Bilirubin 0.9 0.3 - 1.2 mg/dL   GFR, Estimated 52 (L) >60 mL/min    Comment: (NOTE) Calculated using the CKD-EPI Creatinine Equation (2021)    Anion gap 11 5 - 15    Comment: Performed at [facility], [address], [place], [place] [address]  Brain natriuretic peptide     Status: Abnormal   Collection Time: 07/10/20 11:40 PM  Result Value Ref Range   B Natriuretic Peptide 515.2 (H) 0.0 - 100.0 pg/mL    Comment: Performed at [facility],  [address] [place], [place] [address]  Magnesium     Status: None   Collection Time: 07/11/20  2:54 AM  Result Value Ref Range   Magnesium 1.8 1.7 - 2.4 mg/dL    Comment: Performed at [facility], [address], [place], [place] [address]  Phosphorus     Status: Abnormal   Collection Time: 07/11/20  2:54 AM  Result Value Ref Range   Phosphorus 2.2 (L) 2.5 - 4.6 mg/dL    Comment: Performed at [facility], [address], [place], [place] [address]  CBC WITH DIFFERENTIAL     Status: None   Collection Time: 07/11/20  2:54 AM  Result Value Ref Range   WBC 7.5 4.0 - 10.5 K/uL   RBC 4.49 3.87 - 5.11 MIL/uL   Hemoglobin 13.2 12.0 - 15.0 g/dL   HCT 56.2 13.0 - 86.5 %   MCV 86.0 80.0 - 100.0 fL   MCH 29.4 26.0 - 34.0 pg   MCHC 34.2 30.0 - 36.0 g/dL   RDW 78.4 69.6 - 29.5 %   Platelets 162 150 - 400 K/uL   nRBC 0.0 0.0 - 0.2 %   Neutrophils Relative % 57 %   Neutro Abs 4.4 1.7 - 7.7 K/uL   Lymphocytes Relative 32 %   Lymphs Abs 2.4 0.7 - 4.0 K/uL   Monocytes Relative 9 %   Monocytes Absolute 0.7 0.1 - 1.0 K/uL   Eosinophils Relative 1 %   Eosinophils Absolute 0.1 0.0 - 0.5 K/uL   Basophils Relative 1 %   Basophils Absolute 0.1 0.0 - 0.1 K/uL   Immature Granulocytes 0 %    Abs Immature Granulocytes 0.01 0.00 - 0.07 K/uL    Comment: Performed at [facility], [address] [place], [place] [address]  TSH     Status: None   Collection Time: 07/11/20  2:54 AM  Result Value Ref Range   TSH 1.538 0.350 - 4.500 uIU/mL    Comment: Performed by a 3rd Generation assay with a functional sensitivity of <=0.01 uIU/mL. Performed at [facility], [address] [place], [place] [address]   Comprehensive metabolic panel     Status: Abnormal   Collection Time: 07/11/20  2:54 AM  Result Value Ref Range   Sodium 142 135 - 145 mmol/L   Potassium 2.9 (L) 3.5 - 5.1 mmol/L   Chloride 106 98 - 111 mmol/L   CO2 24 22 - 32 mmol/L   Glucose, Bld 139 (H) 70 - 99 mg/dL    Comment: Glucose reference range applies only to samples taken after fasting for at least 8 hours.   BUN 14 8 - 23 mg/dL   Creatinine, Ser 0.27 0.44 - 1.00 mg/dL   Calcium 9.4 8.9 - 25.3 mg/dL   Total Protein 6.0 (L) 6.5 - 8.1 g/dL   Albumin 3.6 3.5 - 5.0 g/dL   AST 13 (L) 15 - 41 U/L   ALT 9 0 - 44 U/L   Alkaline Phosphatase 86 38 - 126 U/L   Total Bilirubin 0.7 0.3 - 1.2 mg/dL   GFR, Estimated >66 >44 mL/min    Comment: (NOTE) Calculated using the CKD-EPI Creatinine Equation (2021)    Anion gap 12 5 - 15    Comment: Performed at [facility], [address] [place], [place] [address]  Troponin I (High Sensitivity)     Status: Abnormal   Collection Time: 07/11/20  2:54 AM  Result Value Ref Range   Troponin I (High Sensitivity) 70 (H) <18 ng/L    Comment: (NOTE) Elevated high sensitivity troponin I (hsTnI) values and significant  changes across serial measurements may suggest ACS but many other  chronic and acute conditions are known to elevate hsTnI results.  Refer to the "Links" section for chest pain algorithms and additional  guidance. Performed at [facility], [address] [place], [place] [address]   Heparin level (unfractionated)     Status: Abnormal    Collection Time: 07/11/20  7:56 AM  Result Value Ref Range   Heparin Unfractionated 0.27 (L) 0.30 - 0.70 IU/mL    Comment: (NOTE) If heparin results are below expected values, and patient dosage has  been confirmed, suggest follow up testing of antithrombin III levels. Performed at [facility], [address], [place], [place] [address]     ECG   Afib with RVR at 131 overnight - Personally Reviewed  Telemetry   Sinus rhythm presently - Personally Reviewed  Radiology    CT Head Wo Contrast  Result Date: 07/10/2020 CLINICAL DATA:  Head trauma, moderate/severe. EXAM: CT HEAD WITHOUT CONTRAST TECHNIQUE: Contiguous axial images were obtained from the base of the skull through the vertex without intravenous contrast. COMPARISON:  Report from noncontrast head CT 01/25/2002 (images unavailable). FINDINGS: Brain: Streak and beam hardening artifact arising from aneurysm clips in the right paraclinoid region and along the course of the M2/M3 right middle cerebral artery branches limits evaluation. A right parietal approach ventricular catheter terminates just the right of midline near the right foramen of Monro. No evidence of hydrocephalus Cerebral volume is normal. Mild ill-defined hypoattenuation within the cerebral white matter is nonspecific, but compatible with chronic small vessel ischemic disease. No evidence of acute intracranial hemorrhage or acute demarcated cortical infarction. No extra-axial fluid collection. No evidence of intracranial mass. No midline shift. Vascular: Atherosclerotic calcifications. Vascular prominence in the region of the distal M1 left middle cerebral artery measuring 6 mm (series 2, image 9). 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 Skull: Calvarial fracture.  Right-sided cranioplasty. Sinuses/Orbits: Visualized orbits show no acute finding. No significant paranasal sinus disease at the imaged levels. IMPRESSION: Examination limited by streak and beam  hardening artifact arising from multiple aneurysm clips. No evidence of acute intracranial hemorrhage or acute infarction. 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 CT angiography of the head is recommended for further evaluation. Right parietal approach ventricular catheter terminating just to the right of midline near the foramen of Monro. No evidence of hydrocephalus. Mild cerebral white matter chronic small vessel ischemic disease. Electronically Signed   By: Jackey Loge DO   On: 07/10/2020 11:53   DG Chest Portable 1 View  Result Date: 07/10/2020 CLINICAL DATA:  Chest pain. EXAM: PORTABLE CHEST 1 VIEW COMPARISON:  05/29/2008. FINDINGS: The heart size and mediastinal contours are within normal limits. Both lungs are clear. No visible pleural effusions or pneumothorax. No acute osseous abnormality. Partially imaged VP shunt coursing along the right neck and chest. IMPRESSION: No active disease. Electronically Signed   By: Feliberto Harts MD   On: 07/10/2020 11:24    Cardiac Studies   Echo pending  Assessment   1. Active Problems: 2.   Chest pain 3.   CAD (coronary artery disease) 4.   Aortic valve replaced 5.   Essential  hypertension 6.   Syncope 7.   Elevated troponin 8.   Hyperlipidemia 9.   Aortic aneurysm (HCC) 10.   Carotid stenosis 11.   Cerebral aneurysm without rupture 12.   Ataxia 13.   Hypokalemia 14.   AKI (acute kidney injury) (HCC) 15.   Atrial fibrillation with RVR (HCC) 16.   Plan   1. Converted to sinus rhythm overnight with addition of amiodarone. Will switch from IV to po today - keep off BB due to pauses and may worsen orthostatic hypotension. Switch IV heparin to Eliquis 5 mg BID. Stop Plavix, continue aspirin 81 mg daily. Will likely wean amiodarone as outpatient and can follow-up with cardiologist in [facility] to discuss other options. With  regards to lipids - I have adjusted meds d/t interaction between atorvastatin and amiodarone + gemfibrozil. Historically high trigs - will recheck lipid profile. Switch atorvastatin to rosuvastatin 40 mg daily and gemfibrozil to fenofibrate 160 mg daily (less potential for interaction) - rosuvastatin does not interact with amiodarone.  Time Spent Directly with Patient:  I have spent a total of 25 minutes with the patient reviewing hospital notes, telemetry, EKGs, labs and examining the patient as well as establishing an assessment and plan that was discussed personally with the patient.  > 50% of time was spent in direct patient care.  Length of Stay:  LOS: 0 days   Chrystie Nose, MD, [facility], FACP  [place]  [facility]  Medical Director of the [facility]  Cardiovascular Risk Reduction [facility] Diplomate of the American Board of Clinical Lipidology Attending Cardiologist  Direct Dial: [phone]  Fax: [phone]  Website:  [email or web address] Irania [email or web address] 07/11/2020, 9:35 AM

## 2020-07-11 NOTE — Progress Notes (Signed)
Pt's D-Dimer was 3.39, MD notified and aware, pt was started on Heparin at 69ml/hr, will continue to monitor, Thanks Lavonda Jumbo RN.

## 2020-07-11 NOTE — Progress Notes (Addendum)
ANTICOAGULATION CONSULT NOTE - Initial Consult  Pharmacy Consult for heparin Indication: atrial fibrillation  Allergies  Allergen Reactions  . Bupropion Other (See Comments)    hallucinations   . Morphine And Related Itching    Patient Measurements: Height: 5\' 6"  (167.6 cm) Weight: 72.1 kg (158 lb 14.4 oz) IBW/kg (Calculated) : 59.3  Vital Signs: Temp: 97.9 F (36.6 C) (02/18 0717) Temp Source: Oral (02/18 0717) BP: 130/113 (02/18 0717) Pulse Rate: 68 (02/18 0717)  Labs: Recent Labs    07/10/20 1122 07/10/20 1324 07/10/20 1539 07/10/20 2340 07/11/20 0254 07/11/20 0756  HGB 14.7  --   --   --  13.2  --   HCT 43.4  --   --   --  38.6  --   PLT 194  --   --   --  162  --   APTT 27  --   --   --   --   --   LABPROT 12.9  --   --   --   --   --   INR 1.0  --   --   --   --   --   HEPARINUNFRC  --   --   --   --   --  0.27*  CREATININE 1.45*  --   --  1.20* 1.00  --   TROPONINIHS 19*   < > 60* 81* 70*  --    < > = values in this interval not displayed.    Estimated Creatinine Clearance: 60.1 mL/min (by C-G formula based on SCr of 1 mg/dL).   Medical History: Past Medical History:  Diagnosis Date  . Hypertension      Assessment: 60yo female with new Afib w/ RVR. Pharmacy consulted for heparin.  First HL slightly low at 0.27. Patient is getting an ECHO now. H/H slight downtrend likely due to infusions, plt stable. No issues with infusion or bleeding per RN (documented as paused but RN confirmed was never stopped, error in documentation). RN will increase rate when able.  Goal of Therapy:  Heparin level 0.3-0.7 units/ml Monitor platelets by anticoagulation protocol: Yes   Plan:  Increase heparin gtt to 1100 units/hr  Monitor daily HL, CBC/plt Monitor for signs/symptoms of bleeding  F/u transition to PO anticoagulant  60yo, PharmD, BCPS, BCCP Clinical Pharmacist  Please check AMION for all [facility] phone numbers After 10:00 PM, call Main  Pharmacy [phone]

## 2020-07-12 DIAGNOSIS — R55 Syncope and collapse: Secondary | ICD-10-CM | POA: Diagnosis not present

## 2020-07-12 DIAGNOSIS — N179 Acute kidney failure, unspecified: Secondary | ICD-10-CM

## 2020-07-12 DIAGNOSIS — I671 Cerebral aneurysm, nonruptured: Secondary | ICD-10-CM

## 2020-07-12 DIAGNOSIS — I6529 Occlusion and stenosis of unspecified carotid artery: Secondary | ICD-10-CM | POA: Diagnosis not present

## 2020-07-12 DIAGNOSIS — R778 Other specified abnormalities of plasma proteins: Secondary | ICD-10-CM

## 2020-07-12 DIAGNOSIS — I4891 Unspecified atrial fibrillation: Secondary | ICD-10-CM | POA: Diagnosis not present

## 2020-07-12 LAB — CBC
HCT: 41.8 % (ref 36.0–46.0)
Hemoglobin: 13.9 g/dL (ref 12.0–15.0)
MCH: 29.1 pg (ref 26.0–34.0)
MCHC: 33.3 g/dL (ref 30.0–36.0)
MCV: 87.6 fL (ref 80.0–100.0)
Platelets: 157 10*3/uL (ref 150–400)
RBC: 4.77 MIL/uL (ref 3.87–5.11)
RDW: 13.7 % (ref 11.5–15.5)
WBC: 6.6 10*3/uL (ref 4.0–10.5)
nRBC: 0 % (ref 0.0–0.2)

## 2020-07-12 MED ORDER — ROSUVASTATIN CALCIUM 40 MG PO TABS: 40.0000 mg | ORAL_TABLET | Freq: Every day | ORAL | 0 refills | Status: AC

## 2020-07-12 MED ORDER — ASPIRIN 81 MG PO CHEW
81.0000 mg | CHEWABLE_TABLET | Freq: Every day | ORAL | Status: DC
  Administered 2020-07-12: 81 mg via ORAL
  Filled 2020-07-12: qty 1

## 2020-07-12 MED ORDER — FENOFIBRATE 160 MG PO TABS: 160.0000 mg | ORAL_TABLET | Freq: Every day | ORAL | 0 refills | Status: AC

## 2020-07-12 MED ORDER — AMIODARONE HCL 200 MG PO TABS: 200.0000 mg | ORAL_TABLET | Freq: Every day | ORAL | 1 refills | Status: DC

## 2020-07-12 NOTE — TOC Transition Note (Signed)
Transition of Care [facility] - CM/SW Discharge Note   Patient Details  Name: Almena Hokenson MRN: [id number] Date of Birth: Sep 28, 1960  Transition of Care [facility]) CM/SW Contact:  Leone Haven, RN Phone Number: 07/12/2020, 10:33 AM   Clinical Narrative:    NCM spoke with patient at bedside, she has the eliquis that has been filled already, NCM assisted her with the Match letter to assist in getting her other medications. Informed patient that she can not use the Match at [employer], she states she will take the printed scripts to one of the [facility] on the Match Letter. She has transportation home.   Final next level of care: Home/Self Care Barriers to Discharge: No Barriers Identified   Patient Goals and CMS Choice Patient states their goals for this hospitalization and ongoing recovery are:: home      Discharge Placement                       Discharge Plan and Services                                     Social Determinants of Health (SDOH) Interventions Food Insecurity Interventions: Other (Comment) (SW-please assess for food stamps eligibility/other resources) Financial Strain Interventions: Other (Comment) (inpt SW/CM consulted to initiate services/resources. will see HF TOC SW.) Housing Interventions: Intervention Not Indicated Transportation Interventions: Intervention Not Indicated Alcohol Brief Interventions/Follow-up: AUDIT Score <7 follow-up not indicated   Readmission Risk Interventions No flowsheet data found.

## 2020-07-12 NOTE — Progress Notes (Addendum)
DAILY PROGRESS NOTE   Patient Name: Cassandra Fernandez Date of Encounter: 07/12/2020 Cardiologist: No primary care provider on file.  Chief Complaint   No chest pain  Patient Profile   Cassandra Fernandez is a 60 y.o. female with a history of coronary artery bypass grafting and revision bioprosthetic aortic valve replacement 2020, abdominal aortic aneurysm with prior repair in 2013, ascending aortic aneurysm repair at the time of aortic root replacement in 2012 on whom we are consulted for atrial fibrillation.  Subjective   Maintaining sinus rhythm on po amiodarone. Transitioned to Eliquis. Echo yesterday showed LVEF 60-65%, grade 2 DD, low 9 mmHg gradient across an aortic bioprosthetic valve, low right heart pressure. Lipids rechecked yesterday - LDL 61, Trigs 206, TC 128.  Objective   Vitals:   07/11/20 1657 07/11/20 2015 07/12/20 0000 07/12/20 0400  BP: 122/77     Pulse: 66 73    Resp: 20  16   Temp: 98.2 F (36.8 C) 98 F (36.7 C)    TempSrc: Oral Oral    SpO2: 96% 96%    Weight:    71.8 kg  Height:        Intake/Output Summary (Last 24 hours) at 07/12/2020 1610 Last data filed at 07/12/2020 0200 Gross per 24 hour  Intake 1132.41 ml  Output 1800 ml  Net -667.59 ml   Filed Weights   07/11/20 0448 07/11/20 1027 07/12/20 0400  Weight: 72.1 kg 72.5 kg 71.8 kg    Physical Exam   General appearance: alert and no distress Neck: no carotid bruit, no JVD and thyroid not enlarged, symmetric, no tenderness/mass/nodules Lungs: clear to auscultation bilaterally Heart: regular rate and rhythm Abdomen: soft, non-tender; bowel sounds normal; no masses,  no organomegaly Extremities: extremities normal, atraumatic, no cyanosis or edema Pulses: 2+ and symmetric Skin: Skin color, texture, turgor normal. No rashes or lesions Neurologic: Grossly normal Psych: Pleasant  Inpatient Medications    Scheduled Meds: . amiodarone  400 mg Oral Daily  . apixaban  5 mg Oral BID  . fenofibrate  160  mg Oral Daily  . rosuvastatin  40 mg Oral QHS    Continuous Infusions:   PRN Meds: acetaminophen **OR** acetaminophen   Labs   Results for orders placed or performed during the hospital encounter of 07/10/20 (from the past 48 hour(s))  Basic metabolic panel     Status: Abnormal   Collection Time: 07/10/20 11:22 AM  Result Value Ref Range   Sodium 142 135 - 145 mmol/L   Potassium 2.9 (L) 3.5 - 5.1 mmol/L   Chloride 104 98 - 111 mmol/L   CO2 26 22 - 32 mmol/L   Glucose, Bld 188 (H) 70 - 99 mg/dL    Comment: Glucose reference range applies only to samples taken after fasting for at least 8 hours.   BUN 16 8 - 23 mg/dL   Creatinine, Ser 9.60 (H) 0.44 - 1.00 mg/dL   Calcium 9.8 8.9 - 45.4 mg/dL   GFR, Estimated 41 (L) >60 mL/min    Comment: (NOTE) Calculated using the CKD-EPI Creatinine Equation (2021)    Anion gap 12 5 - 15    Comment: Performed at [facility], [address] [place], [place] [address]  Troponin I (High Sensitivity)     Status: Abnormal   Collection Time: 07/10/20 11:22 AM  Result Value Ref Range   Troponin I (High Sensitivity) 19 (H) <18 ng/L    Comment: (NOTE) Elevated high sensitivity troponin I (  hsTnI) values and significant  changes across serial measurements may suggest ACS but many other  chronic and acute conditions are known to elevate hsTnI results.  Refer to the "Links" section for chest pain algorithms and additional  guidance. Performed at [facility], [address] [place], [place] [address]   CBC with Differential/Platelet     Status: None   Collection Time: 07/10/20 11:22 AM  Result Value Ref Range   WBC 7.8 4.0 - 10.5 K/uL   RBC 5.04 3.87 - 5.11 MIL/uL   Hemoglobin 14.7 12.0 - 15.0 g/dL   HCT 60.4 54.0 - 98.1 %   MCV 86.1 80.0 - 100.0 fL   MCH 29.2 26.0 - 34.0 pg   MCHC 33.9 30.0 - 36.0 g/dL   RDW 19.1 47.8 - 29.5 %   Platelets 194 150 - 400 K/uL   nRBC 0.0 0.0 - 0.2 %   Neutrophils Relative % 83 %    Neutro Abs 6.5 1.7 - 7.7 K/uL   Lymphocytes Relative 12 %   Lymphs Abs 0.9 0.7 - 4.0 K/uL   Monocytes Relative 4 %   Monocytes Absolute 0.3 0.1 - 1.0 K/uL   Eosinophils Relative 0 %   Eosinophils Absolute 0.0 0.0 - 0.5 K/uL   Basophils Relative 1 %   Basophils Absolute 0.1 0.0 - 0.1 K/uL   Immature Granulocytes 0 %   Abs Immature Granulocytes 0.02 0.00 - 0.07 K/uL    Comment: Performed at [facility], [address] [place], [place] [address]  Magnesium     Status: None   Collection Time: 07/10/20 11:22 AM  Result Value Ref Range   Magnesium 1.9 1.7 - 2.4 mg/dL    Comment: Performed at [facility], [address] [place], [place] [address]  Protime-INR     Status: None   Collection Time: 07/10/20 11:22 AM  Result Value Ref Range   Prothrombin Time 12.9 11.4 - 15.2 seconds   INR 1.0 0.8 - 1.2    Comment: (NOTE) INR goal varies based on device and disease states. Performed at [facility], [address] [place], [place] [address]   APTT     Status: None   Collection Time: 07/10/20 11:22 AM  Result Value Ref Range   aPTT 27 24 - 36 seconds    Comment: Performed at [facility], [address] [place], [place] [address]  SARS CORONAVIRUS 2 (TAT 6-24 HRS)     Status: None   Collection Time: 07/10/20 11:51 AM  Result Value Ref Range   SARS Coronavirus 2 NEGATIVE NEGATIVE    Comment: (NOTE) SARS-CoV-2 target nucleic acids are NOT DETECTED.  The SARS-CoV-2 RNA is generally detectable in upper and lower respiratory specimens during the acute phase of infection. Negative results do not preclude SARS-CoV-2 infection, do not rule out co-infections with other pathogens, and should not be used as the sole basis for treatment or other patient management decisions. Negative results must be combined with clinical observations, patient history, and epidemiological information. The expected result is Negative.  Fact Sheet for  Patients: HairSlick.no  Fact Sheet for Healthcare Providers: quierodirigir.com  This test is not yet approved or cleared by the [place] FDA and  has been authorized for detection and/or diagnosis of SARS-CoV-2 by FDA under an Emergency Use Authorization (EUA). This EUA will remain  in effect (meaning this test can be used) for the duration  of the COVID-19 declaration under Se ction 564(b)(1) of the Act, 21 U.S.C. section 360bbb-3(b)(1), unless the authorization is terminated or revoked sooner.  Performed at [facility], [address], [place], [place] [address]   Troponin I (High Sensitivity)     Status: Abnormal   Collection Time: 07/10/20  1:24 PM  Result Value Ref Range   Troponin I (High Sensitivity) 38 (H) <18 ng/L    Comment: (NOTE) Elevated high sensitivity troponin I (hsTnI) values and significant  changes across serial measurements may suggest ACS but many other  chronic and acute conditions are known to elevate hsTnI results.  Refer to the "Links" section for chest pain algorithms and additional  guidance. Performed at [facility], [address] [place], [place] [address]   Troponin I (High Sensitivity)     Status: Abnormal   Collection Time: 07/10/20  3:39 PM  Result Value Ref Range   Troponin I (High Sensitivity) 60 (H) <18 ng/L    Comment: CRITICAL RESULT CALLED TO, READ BACK BY AND VERIFIED WITH: CRYSTAL ADKINS RN @1638  07/10/2020 OLSONM DELTA CHECK NOTED (NOTE) Elevated high sensitivity troponin I (hsTnI) values and significant  changes across serial measurements may suggest ACS but many other  chronic and acute conditions are known to elevate hsTnI results.  Refer to the Links section for chest pain algorithms and additional  guidance. Performed at [facility], [address] [place], [place] [address]   Urinalysis, Routine w reflex microscopic Urine, Random      Status: Abnormal   Collection Time: 07/10/20 11:04 PM  Result Value Ref Range   Color, Urine YELLOW YELLOW   APPearance HAZY (A) CLEAR   Specific Gravity, Urine 1.019 1.005 - 1.030   pH 5.0 5.0 - 8.0   Glucose, UA NEGATIVE NEGATIVE mg/dL   Hgb urine dipstick NEGATIVE NEGATIVE   Bilirubin Urine NEGATIVE NEGATIVE   Ketones, ur NEGATIVE NEGATIVE mg/dL   Protein, ur NEGATIVE NEGATIVE mg/dL   Nitrite NEGATIVE NEGATIVE   Leukocytes,Ua NEGATIVE NEGATIVE    Comment: Performed at [facility], [address], [place], [place] [address]  Sodium, urine, random     Status: None   Collection Time: 07/10/20 11:04 PM  Result Value Ref Range   Sodium, Ur 11 mmol/L    Comment: Performed at [facility], [address], [place], [place] [address]  Creatinine, urine, random     Status: None   Collection Time: 07/10/20 11:04 PM  Result Value Ref Range   Creatinine, Urine 276.06 mg/dL    Comment: Performed at [facility], [address] [place], [place] [address]  Urine rapid drug screen (hosp performed)     Status: Abnormal   Collection Time: 07/10/20 11:06 PM  Result Value Ref Range   Opiates NONE DETECTED NONE DETECTED   Cocaine NONE DETECTED NONE DETECTED   Benzodiazepines NONE DETECTED NONE DETECTED   Amphetamines NONE DETECTED NONE DETECTED   Tetrahydrocannabinol POSITIVE (A) NONE DETECTED   Barbiturates NONE DETECTED NONE DETECTED    Comment: (NOTE) DRUG SCREEN FOR MEDICAL PURPOSES ONLY.  IF CONFIRMATION IS NEEDED FOR ANY PURPOSE, NOTIFY LAB WITHIN 5 DAYS.  LOWEST DETECTABLE LIMITS FOR URINE DRUG SCREEN Drug Class                     Cutoff (ng/mL) Amphetamine and metabolites    1000 Barbiturate and metabolites    200 Benzodiazepine  200 Tricyclics and metabolites     300 Opiates and metabolites        300 Cocaine and metabolites        300 THC                            50 Performed at [facility], [address] [place],  [place] [address]   Troponin I (High Sensitivity)     Status: Abnormal   Collection Time: 07/10/20 11:40 PM  Result Value Ref Range   Troponin I (High Sensitivity) 81 (H) <18 ng/L    Comment: (NOTE) Elevated high sensitivity troponin I (hsTnI) values and significant  changes across serial measurements may suggest ACS but many other  chronic and acute conditions are known to elevate hsTnI results.  Refer to the "Links" section for chest pain algorithms and additional  guidance. Performed at [facility], [address] [place], [place] [address]   D-dimer, quantitative (not at [facility])     Status: Abnormal   Collection Time: 07/10/20 11:40 PM  Result Value Ref Range   D-Dimer, Quant 3.39 (H) 0.00 - 0.50 ug/mL-FEU    Comment: (NOTE) At the manufacturer cut-off value of 0.5 g/mL FEU, this assay has a negative predictive value of 95-100%.This assay is intended for use in conjunction with a clinical pretest probability (PTP) assessment model to exclude pulmonary embolism (PE) and deep venous thrombosis (DVT) in outpatients suspected of PE or DVT. Results should be correlated with clinical presentation. Performed at [facility], [address] [place], [place] [address]   Comprehensive metabolic panel     Status: Abnormal   Collection Time: 07/10/20 11:40 PM  Result Value Ref Range   Sodium 143 135 - 145 mmol/L   Potassium 3.0 (L) 3.5 - 5.1 mmol/L   Chloride 106 98 - 111 mmol/L   CO2 26 22 - 32 mmol/L   Glucose, Bld 175 (H) 70 - 99 mg/dL    Comment: Glucose reference range applies only to samples taken after fasting for at least 8 hours.   BUN 15 8 - 23 mg/dL   Creatinine, Ser 2.95 (H) 0.44 - 1.00 mg/dL   Calcium 9.8 8.9 - 18.8 mg/dL   Total Protein 6.2 (L) 6.5 - 8.1 g/dL   Albumin 3.7 3.5 - 5.0 g/dL   AST 15 15 - 41 U/L   ALT 10 0 - 44 U/L   Alkaline Phosphatase 92 38 - 126 U/L   Total Bilirubin 0.9 0.3 - 1.2 mg/dL   GFR, Estimated 52 (L) >60 mL/min    Comment:  (NOTE) Calculated using the CKD-EPI Creatinine Equation (2021)    Anion gap 11 5 - 15    Comment: Performed at [facility], [address], [place], [place] [address]  Brain natriuretic peptide     Status: Abnormal   Collection Time: 07/10/20 11:40 PM  Result Value Ref Range   B Natriuretic Peptide 515.2 (H) 0.0 - 100.0 pg/mL    Comment: Performed at [facility], [address], [place], [place] [address]  HIV Antibody (routine testing w rflx)     Status: None   Collection Time: 07/11/20  2:54 AM  Result Value Ref Range   HIV Screen 4th Generation wRfx Non Reactive Non Reactive    Comment: Performed at [facility], [address] [place], [place] [address]  Magnesium  Status: None   Collection Time: 07/11/20  2:54 AM  Result Value Ref Range   Magnesium 1.8 1.7 - 2.4 mg/dL    Comment: Performed at [facility], [address] [place], [place] [address]  Phosphorus     Status: Abnormal   Collection Time: 07/11/20  2:54 AM  Result Value Ref Range   Phosphorus 2.2 (L) 2.5 - 4.6 mg/dL    Comment: Performed at [facility], [address], [place], [place] [address]  CBC WITH DIFFERENTIAL     Status: None   Collection Time: 07/11/20  2:54 AM  Result Value Ref Range   WBC 7.5 4.0 - 10.5 K/uL   RBC 4.49 3.87 - 5.11 MIL/uL   Hemoglobin 13.2 12.0 - 15.0 g/dL   HCT 19.3 79.0 - 24.0 %   MCV 86.0 80.0 - 100.0 fL   MCH 29.4 26.0 - 34.0 pg   MCHC 34.2 30.0 - 36.0 g/dL   RDW 97.3 53.2 - 99.2 %   Platelets 162 150 - 400 K/uL   nRBC 0.0 0.0 - 0.2 %   Neutrophils Relative % 57 %   Neutro Abs 4.4 1.7 - 7.7 K/uL   Lymphocytes Relative 32 %   Lymphs Abs 2.4 0.7 - 4.0 K/uL   Monocytes Relative 9 %   Monocytes Absolute 0.7 0.1 - 1.0 K/uL   Eosinophils Relative 1 %   Eosinophils Absolute 0.1 0.0 - 0.5 K/uL   Basophils Relative 1 %   Basophils Absolute 0.1 0.0 - 0.1 K/uL   Immature Granulocytes 0 %   Abs Immature Granulocytes 0.01 0.00 - 0.07 K/uL     Comment: Performed at [facility], [address], [place], [place] [address]  TSH     Status: None   Collection Time: 07/11/20  2:54 AM  Result Value Ref Range   TSH 1.538 0.350 - 4.500 uIU/mL    Comment: Performed by a 3rd Generation assay with a functional sensitivity of <=0.01 uIU/mL. Performed at [facility], [address], [place], [place] [address]   Comprehensive metabolic panel     Status: Abnormal   Collection Time: 07/11/20  2:54 AM  Result Value Ref Range   Sodium 142 135 - 145 mmol/L   Potassium 2.9 (L) 3.5 - 5.1 mmol/L   Chloride 106 98 - 111 mmol/L   CO2 24 22 - 32 mmol/L   Glucose, Bld 139 (H) 70 - 99 mg/dL    Comment: Glucose reference range applies only to samples taken after fasting for at least 8 hours.   BUN 14 8 - 23 mg/dL   Creatinine, Ser 2.29 0.44 - 1.00 mg/dL   Calcium 9.4 8.9 - 79.8 mg/dL   Total Protein 6.0 (L) 6.5 - 8.1 g/dL   Albumin 3.6 3.5 - 5.0 g/dL   AST 13 (L) 15 - 41 U/L   ALT 9 0 - 44 U/L   Alkaline Phosphatase 86 38 - 126 U/L   Total Bilirubin 0.7 0.3 - 1.2 mg/dL   GFR, Estimated >92 >11 mL/min    Comment: (NOTE) Calculated using the CKD-EPI Creatinine Equation (2021)    Anion gap 12 5 - 15    Comment: Performed at [facility], [address], [place], [place] [address]  Troponin I (High Sensitivity)     Status: Abnormal   Collection Time: 07/11/20  2:54 AM  Result Value Ref Range   Troponin I (High Sensitivity) 70 (H) <  18 ng/L    Comment: (NOTE) Elevated high sensitivity troponin I (hsTnI) values and significant  changes across serial measurements may suggest ACS but many other  chronic and acute conditions are known to elevate hsTnI results.  Refer to the "Links" section for chest pain algorithms and additional  guidance. Performed at [facility], [address], [place], [place] [address]   Heparin level (unfractionated)     Status: Abnormal   Collection Time: 07/11/20  7:56 AM  Result Value Ref Range    Heparin Unfractionated 0.27 (L) 0.30 - 0.70 IU/mL    Comment: (NOTE) If heparin results are below expected values, and patient dosage has  been confirmed, suggest follow up testing of antithrombin III levels. Performed at [facility], [address], [place], [place] [address]   Lipid panel     Status: Abnormal   Collection Time: 07/11/20  7:56 AM  Result Value Ref Range   Cholesterol 128 0 - 200 mg/dL   Triglycerides 098 (H) <150 mg/dL   HDL 26 (L) >11 mg/dL   Total CHOL/HDL Ratio 4.9 RATIO   VLDL 41 (H) 0 - 40 mg/dL   LDL Cholesterol 61 0 - 99 mg/dL    Comment:        Total Cholesterol/HDL:CHD Risk Coronary Heart Disease Risk Table                     Men   Women  1/2 Average Risk   3.4   3.3  Average Risk       5.0   4.4  2 X Average Risk   9.6   7.1  3 X Average Risk  23.4   11.0        Use the calculated Patient Ratio above and the CHD Risk Table to determine the patient's CHD Risk.        ATP III CLASSIFICATION (LDL):  <100     mg/dL   Optimal  914-782  mg/dL   Near or Above                    Optimal  130-159  mg/dL   Borderline  956-213  mg/dL   High  >086     mg/dL   Very High Performed at [facility], [address], [place], [place] [address]   Basic metabolic panel     Status: Abnormal   Collection Time: 07/11/20  1:40 PM  Result Value Ref Range   Sodium 141 135 - 145 mmol/L   Potassium 3.7 3.5 - 5.1 mmol/L   Chloride 110 98 - 111 mmol/L   CO2 22 22 - 32 mmol/L   Glucose, Bld 145 (H) 70 - 99 mg/dL    Comment: Glucose reference range applies only to samples taken after fasting for at least 8 hours.   BUN 9 8 - 23 mg/dL   Creatinine, Ser 9.62 (H) 0.44 - 1.00 mg/dL   Calcium 9.4 8.9 - 95.2 mg/dL   GFR, Estimated >84 >13 mL/min    Comment: (NOTE) Calculated using the CKD-EPI Creatinine Equation (2021)    Anion gap 9 5 - 15    Comment: Performed at [facility], [address] [place], [place] [address]  CBC     Status: None    Collection Time: 07/12/20  4:42 AM  Result Value Ref Range   WBC 6.6 4.0 - 10.5 K/uL   RBC 4.77 3.87 -  5.11 MIL/uL   Hemoglobin 13.9 12.0 - 15.0 g/dL   HCT 20.9 47.0 - 96.2 %   MCV 87.6 80.0 - 100.0 fL   MCH 29.1 26.0 - 34.0 pg   MCHC 33.3 30.0 - 36.0 g/dL   RDW 83.6 62.9 - 47.6 %   Platelets 157 150 - 400 K/uL   nRBC 0.0 0.0 - 0.2 %    Comment: Performed at [facility], [address], [place], [place] [address]    ECG   N/A  Telemetry   Sinus rhythm presently - Personally Reviewed  Radiology    CT Head Wo Contrast  Result Date: 07/10/2020 CLINICAL DATA:  Head trauma, moderate/severe. EXAM: CT HEAD WITHOUT CONTRAST TECHNIQUE: Contiguous axial images were obtained from the base of the skull through the vertex without intravenous contrast. COMPARISON:  Report from noncontrast head CT 01/25/2002 (images unavailable). FINDINGS: Brain: Streak and beam hardening artifact arising from aneurysm clips in the right paraclinoid region and along the course of the M2/M3 right middle cerebral artery branches limits evaluation. A right parietal approach ventricular catheter terminates just the right of midline near the right foramen of Monro. No evidence of hydrocephalus Cerebral volume is normal. Mild ill-defined hypoattenuation within the cerebral white matter is nonspecific, but compatible with chronic small vessel ischemic disease. No evidence of acute intracranial hemorrhage or acute demarcated cortical infarction. No extra-axial fluid collection. No evidence of intracranial mass. No midline shift. Vascular: Atherosclerotic calcifications. Vascular prominence in the region of the distal M1 left middle cerebral artery measuring 6 mm (series 2, image 9). 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 Skull: Calvarial fracture.  Right-sided cranioplasty. Sinuses/Orbits: Visualized orbits show no acute finding. No significant paranasal sinus disease at the imaged levels. IMPRESSION:  Examination limited by streak and beam hardening artifact arising from multiple aneurysm clips. No evidence of acute intracranial hemorrhage or acute infarction. 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 CT angiography of the head is recommended for further evaluation. Right parietal approach ventricular catheter terminating just to the right of midline near the foramen of Monro. No evidence of hydrocephalus. Mild cerebral white matter chronic small vessel ischemic disease. Electronically Signed   By: Jackey Loge DO   On: 07/10/2020 11:53   DG Chest Portable 1 View  Result Date: 07/10/2020 CLINICAL DATA:  Chest pain. EXAM: PORTABLE CHEST 1 VIEW COMPARISON:  05/29/2008. FINDINGS: The heart size and mediastinal contours are within normal limits. Both lungs are clear. No visible pleural effusions or pneumothorax. No acute osseous abnormality. Partially imaged VP shunt coursing along the right neck and chest. IMPRESSION: No active disease. Electronically Signed   By: Feliberto Harts MD   On: 07/10/2020 11:24   ECHOCARDIOGRAM COMPLETE  Result Date: 07/11/2020    ECHOCARDIOGRAM REPORT   Patient Name:   JAZZIE TRAMPE Date of Exam: 07/11/2020 Medical Rec #:  [id number]  Height:       66.0 in Accession #:    [id number] Weight:       158.9 lb Date of Birth:  [birth year]   BSA:          1.814 m Patient Age:    59 years   BP:           130/113 mmHg Patient Gender: F          HR:  65 bpm. Exam Location:  Inpatient Procedure: 2D Echo, Color Doppler and Cardiac Doppler Indications:    R55 Syncope  History:        Patient has prior history of Echocardiogram examinations, most                 recent 05/22/2018. Prior CABG, Arrythmias:Atrial Fibrillation;                 Risk Factors:Hypertension and Dyslipidemia. 07/04/18                 Re-replacement of Aortic Valve/Root with 23mm Carpentier [facility]                  Magna Bioprosthetic.                 Aortic Valve: 23 mm Carpentier [facility] Bioprosthetic valve                 is present in the aortic position. Procedure Date: 07/04/2018.  Sonographer:    Irving Burton Senior RDCS Referring Phys: Consepcion Hearing ANASTASSIA DOUTOVA IMPRESSIONS  1. Left ventricular ejection fraction, by estimation, is 60 to 65%. The left ventricle has normal function. The left ventricle has no regional wall motion abnormalities. There is mild left ventricular hypertrophy. Left ventricular diastolic parameters are consistent with Grade II diastolic dysfunction (pseudonormalization).  2. Right ventricular systolic function is normal. The right ventricular size is normal. There is normal pulmonary artery systolic pressure.  3. The mitral valve is normal in structure. Trivial mitral valve regurgitation. No evidence of mitral stenosis.  4. Tricuspid valve regurgitation is moderate.  5. The aortic valve has been repaired/replaced. Aortic valve regurgitation is not visualized. No aortic stenosis is present. There is a 23 mm Carpentier [facility] Bioprosthetic valve present in the aortic position. Procedure Date: 07/04/2018. Aortic valve mean gradient measures 9.0 mmHg. Aortic valve Vmax measures 2.23 m/s.  6. The inferior vena cava is normal in size with greater than 50% respiratory variability, suggesting right atrial pressure of 3 mmHg. FINDINGS  Left Ventricle: Left ventricular ejection fraction, by estimation, is 60 to 65%. The left ventricle has normal function. The left ventricle has no regional wall motion abnormalities. The left ventricular internal cavity size was normal in size. There is  mild left ventricular hypertrophy. Left ventricular diastolic parameters are consistent with Grade II diastolic dysfunction (pseudonormalization). Right Ventricle: The right ventricular size is normal. No increase in right ventricular wall thickness. Right ventricular systolic function is normal. There is normal  pulmonary artery systolic pressure. The tricuspid regurgitant velocity is 2.48 m/s, and  with an assumed right atrial pressure of 3 mmHg, the estimated right ventricular systolic pressure is 27.6 mmHg. Left Atrium: Left atrial size was normal in size. Right Atrium: Right atrial size was normal in size. Pericardium: There is no evidence of pericardial effusion. Mitral Valve: The mitral valve is normal in structure. Trivial mitral valve regurgitation. No evidence of mitral valve stenosis. Tricuspid Valve: The tricuspid valve is normal in structure. Tricuspid valve regurgitation is moderate . No evidence of tricuspid stenosis. Aortic Valve: The aortic valve has been repaired/replaced. Aortic valve regurgitation is not visualized. No aortic stenosis is present. Aortic valve mean gradient measures 9.0 mmHg. Aortic valve peak gradient measures 19.9 mmHg. Aortic valve area, by VTI  measures 2.75 cm. There is a 23 mm [facility] Bioprosthetic valve present in the aortic position. Procedure Date: 07/04/2018. Pulmonic Valve: The pulmonic valve was normal in structure. Pulmonic  valve regurgitation is not visualized. No evidence of pulmonic stenosis. Aorta: The aortic root is normal in size and structure. Venous: The inferior vena cava is normal in size with greater than 50% respiratory variability, suggesting right atrial pressure of 3 mmHg. IAS/Shunts: No atrial level shunt detected by color flow Doppler.  LEFT VENTRICLE PLAX 2D LVIDd:         3.90 cm  Diastology LVIDs:         2.00 cm  LV e' medial:    4.79 cm/s LV PW:         1.10 cm  LV E/e' medial:  14.6 LV IVS:        1.40 cm  LV e' lateral:   4.35 cm/s LVOT diam:     2.10 cm  LV E/e' lateral: 16.0 LV SV:         106 LV SV Index:   58 LVOT Area:     3.46 cm  RIGHT VENTRICLE RV S prime:     8.92 cm/s TAPSE (M-mode): 1.8 cm LEFT ATRIUM           Index       RIGHT ATRIUM           Index LA diam:      3.90 cm 2.15 cm/m  RA Area:     13.50 cm LA Vol (A2C):  47.5 ml 26.19 ml/m RA Volume:   26.40 ml  14.56 ml/m LA Vol (A4C): 64.2 ml 35.40 ml/m  AORTIC VALVE AV Area (Vmax):    2.03 cm AV Area (Vmean):   2.42 cm AV Area (VTI):     2.75 cm AV Vmax:           223.00 cm/s AV Vmean:          134.000 cm/s AV VTI:            0.384 m AV Peak Grad:      19.9 mmHg AV Mean Grad:      9.0 mmHg LVOT Vmax:         131.00 cm/s LVOT Vmean:        93.600 cm/s LVOT VTI:          0.305 m LVOT/AV VTI ratio: 0.79  AORTA Ao Root diam: 3.60 cm MITRAL VALVE               TRICUSPID VALVE MV Area (PHT): 2.22 cm    TR Peak grad:   24.6 mmHg MV Decel Time: 341 msec    TR Vmax:        248.00 cm/s MV E velocity: 69.80 cm/s MV A velocity: 46.50 cm/s  SHUNTS MV E/A ratio:  1.50        Systemic VTI:  0.30 m                            Systemic Diam: 2.10 cm Donato Schultz MD Electronically signed by Donato Schultz MD Signature Date/Time: 07/11/2020/11:45:02 AM    Final    VAS US CAROTID  Result Date: 07/11/2020 Carotid Arterial Duplex Study Indications:   Syncope. Risk Factors:  Hypertension, hyperlipidemia, current smoker, coronary artery                disease. Other Factors: Afib, angina, CABG, AV replacement, AAA (post repair), CHF. Performing Technologist: Ernestene Mention  Examination Guidelines: A complete evaluation includes B-mode imaging, spectral Doppler, color Doppler, and power Doppler as needed of all  accessible portions of each vessel. Bilateral testing is considered an integral part of a complete examination. Limited examinations for reoccurring indications may be performed as noted.  Right Carotid Findings: +----------+--------+--------+--------+------------------+--------+           PSV cm/sEDV cm/sStenosisPlaque DescriptionComments +----------+--------+--------+--------+------------------+--------+ CCA Prox  52      17                                         +----------+--------+--------+--------+------------------+--------+ CCA Distal52      17                                          +----------+--------+--------+--------+------------------+--------+ ICA Prox  52      18              calcific                   +----------+--------+--------+--------+------------------+--------+ ICA Distal76      27                                         +----------+--------+--------+--------+------------------+--------+ ECA       62      8               calcific                   +----------+--------+--------+--------+------------------+--------+ +----------+--------+-------+----------------+-------------------+           PSV cm/sEDV cmsDescribe        Arm Pressure (mmHG) +----------+--------+-------+----------------+-------------------+ [id number]      0      Multiphasic, WNL                    +----------+--------+-------+----------------+-------------------+ +---------+--------+--+--------+--+---------+ VertebralPSV cm/s48EDV cm/s16Antegrade +---------+--------+--+--------+--+---------+  Left Carotid Findings: +----------+--------+-------+--------+----------------------+------------------+           PSV cm/sEDV    StenosisPlaque Description    Comments                             cm/s                                                    +----------+--------+-------+--------+----------------------+------------------+ CCA Prox  65      16                                   intimal thickening +----------+--------+-------+--------+----------------------+------------------+ CCA Distal59      18             heterogenous and      intimal thickening                                  smooth                                   +----------+--------+-------+--------+----------------------+------------------+  ICA Prox  43      16                                                      +----------+--------+-------+--------+----------------------+------------------+ ICA Distal53      19                                                       +----------+--------+-------+--------+----------------------+------------------+ ECA       101     14                                                      +----------+--------+-------+--------+----------------------+------------------+ +----------+--------+--------+----------------+-------------------+           PSV cm/sEDV cm/sDescribe        Arm Pressure (mmHG) +----------+--------+--------+----------------+-------------------+ [id number]      0       Multiphasic, WNL                    +----------+--------+--------+----------------+-------------------+ +---------+--------+--+--------+--+---------+ VertebralPSV cm/s43EDV cm/s16Antegrade +---------+--------+--+--------+--+---------+   Summary: Right Carotid: The extracranial vessels were near-normal with only minimal wall                thickening or plaque. Left Carotid: The extracranial vessels were near-normal with only minimal wall               thickening or plaque. Vertebrals:  Bilateral vertebral arteries demonstrate antegrade flow. Subclavians: Normal flow hemodynamics were seen in bilateral subclavian              arteries. *See table(s) above for measurements and observations.     Preliminary     Cardiac Studies   Echo above  Assessment   Active Problems:   Chest pain   CAD (coronary artery disease)   Aortic valve replaced   Essential hypertension   Syncope   Elevated troponin   Hyperlipidemia   Aortic aneurysm (HCC)   Carotid stenosis   Cerebral aneurysm without rupture   Ataxia   Hypokalemia   AKI (acute kidney injury) (HCC)   Atrial fibrillation with RVR (HCC)   Plan   1. Maintaining sinus rhythm - now on oral medications and feels well. I have changed a number of medications due to interactions, specifically with her lipid therapy. Will need new Rx's. Continue amiodarone 400 mg daily for now, then decrease to 200 mg daily in 1 week. Her Plavix has been discontinued, however, should  remain on low dose Asprin given CAD/stroke, etc in addition to Eliquis. QTc noted to be 521 ms, however, this was in atrial flutter - would repeat EKG today in sinus rhythm - suspect it will be normal. Ok to d/c home today from our standpoint. Will need follow-up with cardiology in [facility].  [facility] will sign off.   Medication Recommendations:  Eliquis 5 mg BID, aspirin 81 mg daily, Crestor 40 mg daily, fenofibrate 160 mg daily, amiodarone 400 mg daily Other recommendations (labs, testing, etc):  Taper amiodarone as above Follow up as  an outpatient:  [facility] in [facility]  Time Spent Directly with Patient:  I have spent a total of 25 minutes with the patient reviewing hospital notes, telemetry, EKGs, labs and examining the patient as well as establishing an assessment and plan that was discussed personally with the patient.  > 50% of time was spent in direct patient care.  Length of Stay:  LOS: 1 day   Chrystie Nose, MD, [facility], FACP  [place]  [facility]  Medical Director of the [facility]  [facility] Diplomate of the American Board of Clinical Lipidology Attending Cardiologist  Direct Dial: [phone]  Fax: [phone]  Website:  [email or web address] 07/12/2020, 9:23 AM

## 2020-07-12 NOTE — Plan of Care (Signed)
  Problem: Education: Goal: Understanding of cardiac disease, CV risk reduction, and recovery process will improve Outcome: Adequate for Discharge Goal: Individualized Educational Video(s) Outcome: Adequate for Discharge   Problem: Activity: Goal: Ability to tolerate increased activity will improve Outcome: Adequate for Discharge   Problem: Cardiac: Goal: Ability to achieve and maintain adequate cardiovascular perfusion will improve Outcome: Adequate for Discharge   Problem: Health Behavior/Discharge Planning: Goal: Ability to safely manage health-related needs after discharge will improve Outcome: Adequate for Discharge   Problem: Education: Goal: Knowledge of General Education information will improve Description: Including pain rating scale, medication(s)/side effects and non-pharmacologic comfort measures Outcome: Adequate for Discharge   Problem: Health Behavior/Discharge Planning: Goal: Ability to manage health-related needs will improve Outcome: Adequate for Discharge   Problem: Clinical Measurements: Goal: Ability to maintain clinical measurements within normal limits will improve Outcome: Adequate for Discharge Goal: Will remain free from infection Outcome: Adequate for Discharge Goal: Diagnostic test results will improve Outcome: Adequate for Discharge Goal: Respiratory complications will improve Outcome: Adequate for Discharge Goal: Cardiovascular complication will be avoided Outcome: Adequate for Discharge   Problem: Activity: Goal: Risk for activity intolerance will decrease Outcome: Adequate for Discharge   Problem: Nutrition: Goal: Adequate nutrition will be maintained Outcome: Adequate for Discharge   Problem: Coping: Goal: Level of anxiety will decrease Outcome: Adequate for Discharge   Problem: Elimination: Goal: Will not experience complications related to bowel motility Outcome: Adequate for Discharge Goal: Will not experience complications  related to urinary retention Outcome: Adequate for Discharge   Problem: Pain Managment: Goal: General experience of comfort will improve Outcome: Adequate for Discharge   Problem: Safety: Goal: Ability to remain free from injury will improve Outcome: Adequate for Discharge   Problem: Skin Integrity: Goal: Risk for impaired skin integrity will decrease Outcome: Adequate for Discharge   

## 2020-07-14 NOTE — Discharge Summary (Signed)
Physician Discharge Summary  Cassandra Fernandez ZOX:[id number] DOB: Nov 19, 1960 DOA: 07/10/2020  PCP: Patient, No Pcp Per  Admit date: 07/10/2020 Discharge date: 07/12/2020  Time spent: 35 minutes  Recommendations for Outpatient Follow-up:  1. FU with Cardiology Dr.Mathew Elonda Husky in [facility] 2. PCP in 1 week   Discharge Diagnoses:    Near Syncope   Afib with RVR   Chest pain   CAD (coronary artery disease)   Aortic valve replaced   Essential hypertension   Syncope   Elevated troponin   Hyperlipidemia   Aortic aneurysm (HCC)   Carotid stenosis   Cerebral aneurysm without rupture   Ataxia   Hypokalemia   AKI (acute kidney injury) (HCC)   Atrial fibrillation with RVR [facility])   Discharge Condition: stable  Diet recommendation: low sodium heart healthy  Filed Weights   07/11/20 0448 07/11/20 1027 07/12/20 0400  Weight: 72.1 kg 72.5 kg 71.8 kg    History of present illness:  60 year old female with history of CAD, CABG in 2020, aortic valve replacement, abdominal aortic aneurysm presented to the ED after a near syncopal event associated with chest pain, in the ED she was noted to be in A. fib RVR   Hospital Course:   Atrial fibrillation with rapid ventricular response Near syncope -Started on IV amiodarone drip overnight, converted to sinus rhythm yesterday -Cardiology consulted, Started oral amiodarone and apixaban, beta-blocker stopped due to long postconversion pauses -stable in sinus rhythm at the time of discharge -FU with Cardiologist in [facility]  Chest pain, demand ischemia -Resolved, mildly elevated cardiac enzymes likely secondary to A. fib RVR, no symptoms of ACS  History of CAD/CABG -Beta-blocker discontinued, she had long postconversion pauses -Now on amiodarone, continue Eliquis, aspirin discontinued  Severe hypokalemia -Replaced  Aortic aneurysm (HCC) -chronic status post repair  -Followed by vascular surgery at [facility]  H/o Cerebral aneurysm   -chronic, stable   Hypokalemia - -replete  AKI -Improved with hydration    Discharge Exam: Vitals:   07/12/20 0000 07/12/20 0900  BP:  117/78  Pulse:  90  Resp: 16 16  Temp:  (!) 97.3 F (36.3 C)  SpO2:  100%   General exam: Pleasant female sitting up in bed, AAOx3, no distress CVS: S1-S2, regular rate rhythm Lungs: Clear bilaterally Abdomen: Soft, nontender, bowel sounds present Extremities: No edema Skin: No rashes on exposed skin   Discharge Instructions   Discharge Instructions    Diet - low sodium heart healthy   Complete by: As directed    Increase activity slowly   Complete by: As directed      Allergies as of 07/12/2020      Reactions   Bupropion Other (See Comments)   hallucinations   Morphine And Related Itching      Medication List    STOP taking these medications   amLODipine 10 MG tablet Commonly known as: NORVASC   atorvastatin 80 MG tablet Commonly known as: LIPITOR   clopidogrel 75 MG tablet Commonly known as: PLAVIX   metoprolol tartrate 50 MG tablet Commonly known as: LOPRESSOR     TAKE these medications   acetaminophen 500 MG tablet Commonly known as: TYLENOL Take 1,000 mg by mouth every 6 (six) hours as needed for mild pain.   amiodarone 200 MG tablet Commonly known as: PACERONE Take 1-2 tablets (200-400 mg total) by mouth daily. Take  daily for 1 week and then drop to  daily   apixaban 5 MG Tabs tablet Commonly known as: ELIQUIS  Take 1 tablet (5 mg total) by mouth 2 (two) times daily.   aspirin EC 81 MG tablet Take 81 mg by mouth daily. Swallow whole.   fenofibrate 160 MG tablet Take 1 tablet (160 mg total) by mouth daily.   furosemide 40 MG tablet Commonly known as: LASIX Take 20 mg by mouth daily.   lisinopril 5 MG tablet Commonly known as: ZESTRIL Take 10 mg by mouth daily.   rosuvastatin 40 MG tablet Commonly known as: CRESTOR Take 1 tablet (40 mg total) by mouth at bedtime.       Allergies  Allergen Reactions  . Bupropion Other (See Comments)    hallucinations   . Morphine And Related Itching      The results of significant diagnostics from this hospitalization (including imaging, microbiology, ancillary and laboratory) are listed below for reference.    Significant Diagnostic Studies: CT Head Wo Contrast  Result Date: 07/10/2020 CLINICAL DATA:  Head trauma, moderate/severe. EXAM: CT HEAD WITHOUT CONTRAST TECHNIQUE: Contiguous axial images were obtained from the base of the skull through the vertex without intravenous contrast. COMPARISON:  Report from noncontrast head CT 01/25/2002 (images unavailable). FINDINGS: Brain: Streak and beam hardening artifact arising from aneurysm clips in the right paraclinoid region and along the course of the M2/M3 right middle cerebral artery branches limits evaluation. A right parietal approach ventricular catheter terminates just the right of midline near the right foramen of Monro. No evidence of hydrocephalus Cerebral volume is normal. Mild ill-defined hypoattenuation within the cerebral white matter is nonspecific, but compatible with chronic small vessel ischemic disease. No evidence of acute intracranial hemorrhage or acute demarcated cortical infarction. No extra-axial fluid collection. No evidence of intracranial mass. No midline shift. Vascular: Atherosclerotic calcifications. Vascular prominence in the region of the distal M1 left middle cerebral artery measuring 6 mm (series 2, image 9). 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 Skull: Calvarial fracture.  Right-sided cranioplasty. Sinuses/Orbits: Visualized orbits show no acute finding. No significant paranasal sinus disease at the imaged levels. IMPRESSION: Examination limited by streak and beam hardening artifact arising from multiple aneurysm clips. No evidence of acute intracranial hemorrhage or acute infarction. 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 CT angiography of the head is recommended for further evaluation. Right parietal approach ventricular catheter terminating just to the right of midline near the foramen of Monro. No evidence of hydrocephalus. Mild cerebral white matter chronic small vessel ischemic disease. Electronically Signed   By: Jackey Loge DO   On: 07/10/2020 11:53   DG Chest Portable 1 View  Result Date: 07/10/2020 CLINICAL DATA:  Chest pain. EXAM: PORTABLE CHEST 1 VIEW COMPARISON:  05/29/2008. FINDINGS: The heart size and mediastinal contours are within normal limits. Both lungs are clear. No visible pleural effusions or pneumothorax. No acute osseous abnormality. Partially imaged VP shunt coursing along the right neck and chest. IMPRESSION: No active disease. Electronically Signed   By: Feliberto Harts MD   On: 07/10/2020 11:24   ECHOCARDIOGRAM COMPLETE  Result Date: 07/11/2020    ECHOCARDIOGRAM REPORT   Patient Name:   Cassandra Fernandez Date of Exam: 07/11/2020 Medical Rec #:  [id number]  Height:       66.0 in Accession #:    [id number] Weight:       158.9 lb Date of Birth:  June 05, 1960   BSA:  1.814 m Patient Age:    59 years   BP:           130/113 mmHg Patient Gender: F          HR:           65 bpm. Exam Location:  Inpatient Procedure: 2D Echo, Color Doppler and Cardiac Doppler Indications:    R55 Syncope  History:        Patient has prior history of Echocardiogram examinations, most                 recent 05/22/2018. Prior CABG, Arrythmias:Atrial Fibrillation;                 Risk Factors:Hypertension and Dyslipidemia. 07/04/18                 Re-replacement of Aortic Valve/Root with 67mm Carpentier Edwards                 Magna Bioprosthetic.                 Aortic Valve: 23 mm Carpentier [facility] Bioprosthetic valve                 is present in the aortic position. Procedure Date: 07/04/2018.  Sonographer:    Irving Burton  Senior RDCS Referring Phys: Consepcion Hearing ANASTASSIA DOUTOVA IMPRESSIONS  1. Left ventricular ejection fraction, by estimation, is 60 to 65%. The left ventricle has normal function. The left ventricle has no regional wall motion abnormalities. There is mild left ventricular hypertrophy. Left ventricular diastolic parameters are consistent with Grade II diastolic dysfunction (pseudonormalization).  2. Right ventricular systolic function is normal. The right ventricular size is normal. There is normal pulmonary artery systolic pressure.  3. The mitral valve is normal in structure. Trivial mitral valve regurgitation. No evidence of mitral stenosis.  4. Tricuspid valve regurgitation is moderate.  5. The aortic valve has been repaired/replaced. Aortic valve regurgitation is not visualized. No aortic stenosis is present. There is a 23 mm Carpentier [facility] Bioprosthetic valve present in the aortic position. Procedure Date: 07/04/2018. Aortic valve mean gradient measures 9.0 mmHg. Aortic valve Vmax measures 2.23 m/s.  6. The inferior vena cava is normal in size with greater than 50% respiratory variability, suggesting right atrial pressure of 3 mmHg. FINDINGS  Left Ventricle: Left ventricular ejection fraction, by estimation, is 60 to 65%. The left ventricle has normal function. The left ventricle has no regional wall motion abnormalities. The left ventricular internal cavity size was normal in size. There is  mild left ventricular hypertrophy. Left ventricular diastolic parameters are consistent with Grade II diastolic dysfunction (pseudonormalization). Right Ventricle: The right ventricular size is normal. No increase in right ventricular wall thickness. Right ventricular systolic function is normal. There is normal pulmonary artery systolic pressure. The tricuspid regurgitant velocity is 2.48 m/s, and  with an assumed right atrial pressure of 3 mmHg, the estimated right ventricular systolic pressure is 27.6 mmHg. Left  Atrium: Left atrial size was normal in size. Right Atrium: Right atrial size was normal in size. Pericardium: There is no evidence of pericardial effusion. Mitral Valve: The mitral valve is normal in structure. Trivial mitral valve regurgitation. No evidence of mitral valve stenosis. Tricuspid Valve: The tricuspid valve is normal in structure. Tricuspid valve regurgitation is moderate . No evidence of tricuspid stenosis. Aortic Valve: The aortic valve has been repaired/replaced. Aortic valve regurgitation is not visualized. No aortic stenosis is present. Aortic valve mean gradient  measures 9.0 mmHg. Aortic valve peak gradient measures 19.9 mmHg. Aortic valve area, by VTI  measures 2.75 cm. There is a 23 mm Carpentier [facility] Bioprosthetic valve present in the aortic position. Procedure Date: 07/04/2018. Pulmonic Valve: The pulmonic valve was normal in structure. Pulmonic valve regurgitation is not visualized. No evidence of pulmonic stenosis. Aorta: The aortic root is normal in size and structure. Venous: The inferior vena cava is normal in size with greater than 50% respiratory variability, suggesting right atrial pressure of 3 mmHg. IAS/Shunts: No atrial level shunt detected by color flow Doppler.  LEFT VENTRICLE PLAX 2D LVIDd:         3.90 cm  Diastology LVIDs:         2.00 cm  LV e' medial:    4.79 cm/s LV PW:         1.10 cm  LV E/e' medial:  14.6 LV IVS:        1.40 cm  LV e' lateral:   4.35 cm/s LVOT diam:     2.10 cm  LV E/e' lateral: 16.0 LV SV:         106 LV SV Index:   58 LVOT Area:     3.46 cm  RIGHT VENTRICLE RV S prime:     8.92 cm/s TAPSE (M-mode): 1.8 cm LEFT ATRIUM           Index       RIGHT ATRIUM           Index LA diam:      3.90 cm 2.15 cm/m  RA Area:     13.50 cm LA Vol (A2C): 47.5 ml 26.19 ml/m RA Volume:   26.40 ml  14.56 ml/m LA Vol (A4C): 64.2 ml 35.40 ml/m  AORTIC VALVE AV Area (Vmax):    2.03 cm AV Area (Vmean):   2.42 cm AV Area (VTI):     2.75 cm AV Vmax:            223.00 cm/s AV Vmean:          134.000 cm/s AV VTI:            0.384 m AV Peak Grad:      19.9 mmHg AV Mean Grad:      9.0 mmHg LVOT Vmax:         131.00 cm/s LVOT Vmean:        93.600 cm/s LVOT VTI:          0.305 m LVOT/AV VTI ratio: 0.79  AORTA Ao Root diam: 3.60 cm MITRAL VALVE               TRICUSPID VALVE MV Area (PHT): 2.22 cm    TR Peak grad:   24.6 mmHg MV Decel Time: 341 msec    TR Vmax:        248.00 cm/s MV E velocity: 69.80 cm/s MV A velocity: 46.50 cm/s  SHUNTS MV E/A ratio:  1.50        Systemic VTI:  0.30 m                            Systemic Diam: 2.10 cm Donato Schultz MD Electronically signed by Donato Schultz MD Signature Date/Time: 07/11/2020/11:45:02 AM    Final    VAS US CAROTID  Result Date: 07/12/2020 Carotid Arterial Duplex Study Indications:   Syncope. Risk Factors:  Hypertension, hyperlipidemia, current smoker, coronary artery  disease. Other Factors: Afib, angina, CABG, AV replacement, AAA (post repair), CHF. Performing Technologist: Ernestene Mention  Examination Guidelines: A complete evaluation includes B-mode imaging, spectral Doppler, color Doppler, and power Doppler as needed of all accessible portions of each vessel. Bilateral testing is considered an integral part of a complete examination. Limited examinations for reoccurring indications may be performed as noted.  Right Carotid Findings: +----------+--------+--------+--------+------------------+--------+           PSV cm/sEDV cm/sStenosisPlaque DescriptionComments +----------+--------+--------+--------+------------------+--------+ CCA Prox  52      17                                         +----------+--------+--------+--------+------------------+--------+ CCA Distal52      17                                         +----------+--------+--------+--------+------------------+--------+ ICA Prox  52      18              calcific                    +----------+--------+--------+--------+------------------+--------+ ICA Distal76      27                                         +----------+--------+--------+--------+------------------+--------+ ECA       62      8               calcific                   +----------+--------+--------+--------+------------------+--------+ +----------+--------+-------+----------------+-------------------+           PSV cm/sEDV cmsDescribe        Arm Pressure (mmHG) +----------+--------+-------+----------------+-------------------+ [id number]      0      Multiphasic, WNL                    +----------+--------+-------+----------------+-------------------+ +---------+--------+--+--------+--+---------+ VertebralPSV cm/s48EDV cm/s16Antegrade +---------+--------+--+--------+--+---------+  Left Carotid Findings: +----------+--------+-------+--------+----------------------+------------------+           PSV cm/sEDV    StenosisPlaque Description    Comments                             cm/s                                                    +----------+--------+-------+--------+----------------------+------------------+ CCA Prox  65      16                                   intimal thickening +----------+--------+-------+--------+----------------------+------------------+ CCA Distal59      18             heterogenous and      intimal thickening  smooth                                   +----------+--------+-------+--------+----------------------+------------------+ ICA Prox  43      16                                                      +----------+--------+-------+--------+----------------------+------------------+ ICA Distal53      19                                                      +----------+--------+-------+--------+----------------------+------------------+ ECA       101     14                                                       +----------+--------+-------+--------+----------------------+------------------+ +----------+--------+--------+----------------+-------------------+           PSV cm/sEDV cm/sDescribe        Arm Pressure (mmHG) +----------+--------+--------+----------------+-------------------+ [id number]      0       Multiphasic, WNL                    +----------+--------+--------+----------------+-------------------+ +---------+--------+--+--------+--+---------+ VertebralPSV cm/s43EDV cm/s16Antegrade +---------+--------+--+--------+--+---------+   Summary: Right Carotid: The extracranial vessels were near-normal with only minimal wall                thickening or plaque. Left Carotid: The extracranial vessels were near-normal with only minimal wall               thickening or plaque. Vertebrals:  Bilateral vertebral arteries demonstrate antegrade flow. Subclavians: Normal flow hemodynamics were seen in bilateral subclavian              arteries. *See table(s) above for measurements and observations.  Electronically signed by Heath Larkhomas Hawken on 07/12/2020 at 10:54:49 AM.    Final     Microbiology: Recent Results (from the past 240 hour(s))  SARS CORONAVIRUS 2 (TAT 6-24 HRS)     Status: None   Collection Time: 07/10/20 11:51 AM  Result Value Ref Range Status   SARS Coronavirus 2 NEGATIVE NEGATIVE Final    Comment: (NOTE) SARS-CoV-2 target nucleic acids are NOT DETECTED.  The SARS-CoV-2 RNA is generally detectable in upper and lower respiratory specimens during the acute phase of infection. Negative results do not preclude SARS-CoV-2 infection, do not rule out co-infections with other pathogens, and should not be used as the sole basis for treatment or other patient management decisions. Negative results must be combined with clinical observations, patient history, and epidemiological information. The expected result is Negative.  Fact Sheet for  Patients: HairSlick.nohttps://www.fda.gov/media/138098/download  Fact Sheet for Healthcare Providers: quierodirigir.comhttps://www.fda.gov/media/138095/download  This test is not yet approved or cleared by the [place] FDA and  has been authorized for detection and/or diagnosis of SARS-CoV-2 by FDA under an Emergency Use Authorization (EUA). This EUA will remain  in effect (meaning this test can be used) for the duration of the COVID-19  declaration under Se ction 564(b)(1) of the Act, 21 U.S.C. section 360bbb-3(b)(1), unless the authorization is terminated or revoked sooner.  Performed at [facility], [address] [place], [place] [address]      Labs: Basic Metabolic Panel: Recent Labs  Lab 07/10/20 1122 07/10/20 2340 07/11/20 0254 07/11/20 1340  NA 142 143 142 141  K 2.9* 3.0* 2.9* 3.7  CL 104 106 106 110  CO2 26 26 24 22   GLUCOSE 188* 175* 139* 145*  BUN 16 15 14 9   CREATININE 1.45* 1.20* 1.00 1.05*  CALCIUM 9.8 9.8 9.4 9.4  MG 1.9  --  1.8  --   PHOS  --   --  2.2*  --    Liver Function Tests: Recent Labs  Lab 07/10/20 2340 07/11/20 0254  AST 15 13*  ALT 10 9  ALKPHOS 92 86  BILITOT 0.9 0.7  PROT 6.2* 6.0*  ALBUMIN 3.7 3.6   No results for input(s): LIPASE, AMYLASE in the last 168 hours. No results for input(s): AMMONIA in the last 168 hours. CBC: Recent Labs  Lab 07/10/20 1122 07/11/20 0254 07/12/20 0442  WBC 7.8 7.5 6.6  NEUTROABS 6.5 4.4  --   HGB 14.7 13.2 13.9  HCT 43.4 38.6 41.8  MCV 86.1 86.0 87.6  PLT 194 162 157   Cardiac Enzymes: No results for input(s): CKTOTAL, CKMB, CKMBINDEX, TROPONINI in the last 168 hours. BNP: BNP (last 3 results) Recent Labs    07/10/20 2340  BNP 515.2*    ProBNP (last 3 results) No results for input(s): PROBNP in the last 8760 hours.  CBG: No results for input(s): GLUCAP in the last 168 hours.     Signed:  07/14/20 MD.  [facility] 07/14/2020, 2:00 PM

## 2020-07-17 ENCOUNTER — Telehealth (HOSPITAL_COMMUNITY): Payer: Self-pay | Admitting: Pharmacist

## 2020-07-17 NOTE — Telephone Encounter (Signed)
Transitions of Care Pharmacy   Call attempted for a pharmacy transitions of care follow-up. HIPAA appropriate voicemail was left with call back information provided.   Call attempt #1. Will follow-up in 2-3 days.   Elita Dame Ann Pheng Prokop, PharmD    

## 2020-07-18 ENCOUNTER — Telehealth (HOSPITAL_COMMUNITY): Payer: Self-pay

## 2020-07-18 ENCOUNTER — Encounter (HOSPITAL_COMMUNITY): Payer: Self-pay

## 2020-07-18 NOTE — Progress Notes (Incomplete)
  [facility]  PCP: Primary Cardiologist:  HPI:  Cassandra Fernandez is a 60 y.o.  female  with a PMH significant for     ROS: All systems negative except as listed in HPI, PMH and Problem List.  SH:  Social History   Socioeconomic History  . Marital status: Single    Spouse name: Not on file  . Number of children: Not on file  . Years of education: Not on file  . Highest education level: Not on file  Occupational History  . Not on file  Tobacco Use  . Smoking status: Current Every Day Smoker  . Smokeless tobacco: Never Used  Vaping Use  . Vaping Use: Never used  Substance and Sexual Activity  . Alcohol use: Never  . Drug use: Yes    Types: Marijuana    Comment: yesterday  . Sexual activity: Not on file  Other Topics Concern  . Not on file  Social History Narrative  . Not on file   Social Determinants of Health   Financial Resource Strain: High Risk  . Difficulty of Paying Living Expenses: Very hard  Food Insecurity: Food Insecurity Present  . Worried About Programme researcher, broadcasting/film/video in the Last Year: Sometimes true  . Ran Out of Food in the Last Year: Sometimes true  Transportation Needs: No Transportation Needs  . Lack of Transportation (Medical): No  . Lack of Transportation (Non-Medical): No  Physical Activity: Not on file  Stress: Not on file  Social Connections: Not on file  Intimate Partner Violence: Not on file    FH:  Family History  Problem Relation Age of Onset  . Hypertension Other     Past Medical History:  Diagnosis Date  . Hypertension     Current Outpatient Medications  Medication Sig Dispense Refill  . acetaminophen (TYLENOL) 500 MG tablet Take 1,000 mg by mouth every 6 (six) hours as needed for mild pain.    Marland Kitchen amiodarone (PACERONE) 200 MG tablet Take 1-2 tablets (200-400 mg total) by mouth daily. Take 400mg  daily for 1 week and then drop to 200mg  daily 30 tablet 1  . apixaban (ELIQUIS) 5 MG TABS tablet  Take 1 tablet (5 mg total) by mouth 2 (two) times daily. 60 tablet 0  . aspirin EC 81 MG tablet Take 81 mg by mouth daily. Swallow whole.    . fenofibrate 160 MG tablet Take 1 tablet (160 mg total) by mouth daily. 30 tablet 0  . furosemide (LASIX) 40 MG tablet Take 20 mg by mouth daily.    lisinopril (ZESTRIL) 5 MG tablet Take 10 mg by mouth daily.    . rosuvastatin (CRESTOR) 40 MG tablet Take 1 tablet (40 mg total) by mouth at bedtime. 30 tablet 0   No current facility-administered medications for this visit.    There were no vitals filed for this visit.  PHYSICAL EXAM: ***   ECG   ASSESSMENT & PLAN:  NYHA Class     Refer to Social Work:  PCP  Medications  Transportation   ETOH   Drug Abuse to Pharmacy:  Refer to Marland Kitchen :  Refer to Home Health:   Refer to [facility]:  Refer to General Cardiology Other    Follow up

## 2020-07-18 NOTE — Telephone Encounter (Signed)
Call attempted to confirm HV TOC appt 1pm today. HIPPA appropriate VM left with callback number.   Ozella Rocks, RN, BSN Heart Failure Nurse Navigator [phone]

## 2020-07-22 ENCOUNTER — Telehealth (HOSPITAL_COMMUNITY): Payer: Self-pay

## 2020-07-22 ENCOUNTER — Other Ambulatory Visit: Payer: Self-pay

## 2020-07-22 ENCOUNTER — Telehealth (HOSPITAL_COMMUNITY): Payer: Self-pay | Admitting: Pharmacist

## 2020-07-22 ENCOUNTER — Ambulatory Visit (HOSPITAL_COMMUNITY)
Admission: RE | Admit: 2020-07-22 | Discharge: 2020-07-22 | Disposition: A | Discharge status: Home / Self Care | Payer: Self-pay | Source: Ambulatory Visit | Attending: Internal Medicine | Admitting: Internal Medicine

## 2020-07-22 ENCOUNTER — Encounter (HOSPITAL_COMMUNITY): Payer: Self-pay

## 2020-07-22 VITALS — BP 146/80 | HR 80 | Wt 166.4 lb

## 2020-07-22 DIAGNOSIS — I110 Hypertensive heart disease with heart failure: Secondary | ICD-10-CM | POA: Insufficient documentation

## 2020-07-22 DIAGNOSIS — Z7982 Long term (current) use of aspirin: Secondary | ICD-10-CM | POA: Insufficient documentation

## 2020-07-22 DIAGNOSIS — Z7901 Long term (current) use of anticoagulants: Secondary | ICD-10-CM | POA: Insufficient documentation

## 2020-07-22 DIAGNOSIS — Z953 Presence of xenogenic heart valve: Secondary | ICD-10-CM | POA: Insufficient documentation

## 2020-07-22 DIAGNOSIS — I2510 Atherosclerotic heart disease of native coronary artery without angina pectoris: Secondary | ICD-10-CM | POA: Insufficient documentation

## 2020-07-22 DIAGNOSIS — Z951 Presence of aortocoronary bypass graft: Secondary | ICD-10-CM | POA: Insufficient documentation

## 2020-07-22 DIAGNOSIS — Z87891 Personal history of nicotine dependence: Secondary | ICD-10-CM | POA: Insufficient documentation

## 2020-07-22 DIAGNOSIS — I480 Paroxysmal atrial fibrillation: Secondary | ICD-10-CM | POA: Insufficient documentation

## 2020-07-22 DIAGNOSIS — Z8249 Family history of ischemic heart disease and other diseases of the circulatory system: Secondary | ICD-10-CM | POA: Insufficient documentation

## 2020-07-22 DIAGNOSIS — Z79899 Other long term (current) drug therapy: Secondary | ICD-10-CM | POA: Insufficient documentation

## 2020-07-22 DIAGNOSIS — I5032 Chronic diastolic (congestive) heart failure: Secondary | ICD-10-CM | POA: Insufficient documentation

## 2020-07-22 MED ORDER — DAPAGLIFLOZIN PROPANEDIOL 10 MG PO TABS: 10.0000 mg | ORAL_TABLET | Freq: Every day | ORAL | 0 refills | Status: AC

## 2020-07-22 NOTE — Telephone Encounter (Signed)
Transitions of Care Pharmacy   Call attempted for a pharmacy transitions of care follow-up. HIPAA appropriate voicemail was left with call back information provided.   Call attempt #2. Will follow-up in 2-3 days.   Cleotilde Spadaccini Ann Era Parr, PharmD    

## 2020-07-22 NOTE — Progress Notes (Signed)
[facility] Trans[facility]  PCP: Ranae Pila, FNP Primary Cardiologist: Dr. Vernona Rieger  HPI:  Cassandra Fernandez is a 60 y.o.  female  with a PMH significant for CAD s/p CABG 06/2018, Aortic Dissection, AVR, recently diagnosed Afib, Tobacco Use Disorder  Her initial cardiac care began in 2012 when she suffered an acute aortic dissection. She underwent aortic root repair with placement of a bioprosthetic aortic valve replacement. She did not follow up at [facility].     She later presented to [place] in December 2019 with shortness of breath, pneumonia and was found to have an elevated troponin level consistent with an acute coronary syndrome. Echocardiography and cardiac catheterization suggested severe aortic regurgitation and severe multivessel coronary artery disease. She was transferred to [facility] for management of her valvular heart disease and coronary disease as she was a previous recipient of a sternotomy with her valve surgery and root replacement previously. She was treated for her pneumonia and ultimately underwent repeat cardiac catheterization which reportedly demonstrated aneurysmal coronary disease. This was not felt to be suitable for percutaneous intervention and ultimately she underwent surgical treatment with a LIMA placed to the LAD, other vessels were not revascularized. She also underwent redo aortic valve replacement with another bioprosthetic valve.  Recently admitted to [facility] 06/2020 with chest pain and near syncope symptoms found to be in Afib with RVR and started on IV amiodarone.  She was started on apixaban and converted to NSR with IV amiodarone and was switched to oral amio 400mg  daily.  Her beta blocker was held due to post conversion pauses.  Her chest pain resolved and was thought to be due to demand ischemia.  She never required diuresis during her stay.    Feeling well since leaving the hospital.  She has  had no further chest pain, shortness of breath.  Has been doing her usual routine goes to the store, cleaning the house.  Has not had any further dizziness or shortness of breath with ambulation.  Not driving currently.    ROS: All systems negative except as listed in HPI, PMH and Problem List.  SH:  Social History   Socioeconomic History  . Marital status: Single    Spouse name: Not on file  . Number of children: Not on file  . Years of education: Not on file  . Highest education level: Not on file  Occupational History  . Not on file  Tobacco Use  . Smoking status: Former Smoker    Types: Cigarettes    Quit date: 07/14/2020    Years since quitting: 0.0  . Smokeless tobacco: Never Used  Vaping Use  . Vaping Use: Never used  Substance and Sexual Activity  . Alcohol use: Never  . Drug use: Yes    Types: Marijuana    Comment: yesterday  . Sexual activity: Not on file  Other Topics Concern  . Not on file  Social History Narrative  . Not on file   Social Determinants of Health   Financial Resource Strain: High Risk  . Difficulty of Paying Living Expenses: Very hard  Food Insecurity: Food Insecurity Present  . Worried About 07/16/2020 in the Last Year: Sometimes true  . Ran Out of Food in the Last Year: Sometimes true  Transportation Needs: No Transportation Needs  . Lack of Transportation (Medical): No  . Lack of Transportation (Non-Medical): No  Physical Activity: Not on file  Stress:  Not on file  Social Connections: Not on file  Intimate Partner Violence: Not on file    FH:  Family History  Problem Relation Age of Onset  . Hypertension Other     Past Medical History:  Diagnosis Date  . Hypertension     Current Outpatient Medications  Medication Sig Dispense Refill  . acetaminophen (TYLENOL) 500 MG tablet Take 1,000 mg by mouth every 6 (six) hours as needed for mild pain.    Marland Kitchen amiodarone (PACERONE) 200 MG tablet Take 200 mg by mouth daily.    Marland Kitchen  apixaban (ELIQUIS) 5 MG TABS tablet Take 1 tablet (5 mg total) by mouth 2 (two) times daily. 60 tablet 0  . aspirin EC 81 MG tablet Take 81 mg by mouth daily. Swallow whole.    . dapagliflozin propanediol (FARXIGA) 10 MG TABS tablet Take 1 tablet (10 mg total) by mouth daily before breakfast. 30 tablet 0  . fenofibrate 160 MG tablet Take 1 tablet (160 mg total) by mouth daily. 30 tablet 0  . furosemide (LASIX) 40 MG tablet Take 20 mg by mouth daily.    Marland Kitchen lisinopril (ZESTRIL) 5 MG tablet Take 10 mg by mouth daily.    . rosuvastatin (CRESTOR) 40 MG tablet Take 1 tablet (40 mg total) by mouth at bedtime. 30 tablet 0   No current facility-administered medications for this encounter.    Vitals:   07/22/20 1119  BP: (!) 146/80  Pulse: 80  SpO2: 98%  Weight: 75.5 kg (166 lb 6 oz)    PHYSICAL EXAM: Cardiac: JVD flat, normal rate and rhythm, clear s1 and s2, 2/6 TR murmur, no rubs or gallops, no LE edema Pulmonary: CTAB, not in distress Abdominal: non distended abdomen, soft and nontender Psych: Alert, conversant, in good spirits    ECG   NSR, rate of 74, borderline prolonged QT   ASSESSMENT & PLAN: Chronic Diastolic CHF: -2019 LHC with severe diffuse disease, s/p CABG LIMA to LAD and redo AVR   -NYHA Class II symptoms, euvolemic on exam -Most of her recent symptoms seem to be centered around her afib -She has maintained euvolemia on lasix 40mg  for some time -Currently taking lisinopril 10mg  -Start today consider adding spiro and decreasing lasix at next visit  PAF: -CHADS2VASC 4 -remains in NSR -decrease amiodarone to 200mg  daily, continue eliquis -may be able to add carvedilol at next visit -needs sleep study  Aneurysmal Vascular Disease: -hx of cerebral, aortic, coronary aneurysms  -follows with vascular surgery -She may have fibromuscular dysplasia or a connective tissue disease consider further workup to determine  Tobacco use: -quit since  hospitalization   Follow up with her general cardiologist at [place]

## 2020-07-22 NOTE — Progress Notes (Signed)
[facility]  Heart Failure Pharmacist Encounter  Patient provided with Eliquis and Farxiga patient assistance applications. Assisted patient with filling out patient information and signing her portion of application. Since she follows with PCP and cardiology outside of [facility], I gave her and her husband the paperwork to bring to her next visit for the long-term provider to complete. My contact information was left on the form in case they have any questions with how to complete/submit.  Patient was provided with 30-day free card for [facility] and still has Eliquis (free 30-day card already used).   Sharen Hones, PharmD, BCPS Heart Failure [facility] Pharmacist [phone]

## 2020-07-22 NOTE — Telephone Encounter (Signed)
Call attempted to confirm [facility] TOC appt today @ 11AM, no answer. HIPPA appropriate VM left with callback number.

## 2020-07-22 NOTE — Patient Instructions (Signed)
Start Farxiga 10 mg Daily  Thank you for allowing [place] to provider your heart failure care after your recent hospitalization. Please follow-up with [facility]  If you have any questions, issues, or concerns before your next appointment please call our office at [phone], opt. [phone] and leave a message for the triage nurse.

## 2020-08-08 ENCOUNTER — Telehealth (HOSPITAL_COMMUNITY): Payer: Self-pay

## 2020-08-08 NOTE — Telephone Encounter (Signed)
Transitions of Care Pharmacy   Call attempted for a pharmacy transitions of care follow-up. HIPAA appropriate voicemail was left with call back information provided.   Call attempt #3. Will no longer attempt to follow up.

## 2021-11-16 IMAGING — CT CT HEAD W/O CM
3 series · 15 of 47 positions shown, 18 images · non-contrast
Comparison: Report from noncontrast head CT 01/25/2002 (images
unavailable).

CLINICAL DATA: Head trauma, moderate/severe.

EXAM:
CT HEAD WITHOUT CONTRAST
TECHNIQUE: Contiguous axial images were obtained from the base of the skull
through the vertex without intravenous contrast.

[Series 2: head wo · axial · 0.41mm/px · z∈[-169,-34]mm · 9 of 33 slices shown, 12 images]
[im 3/33  brain]
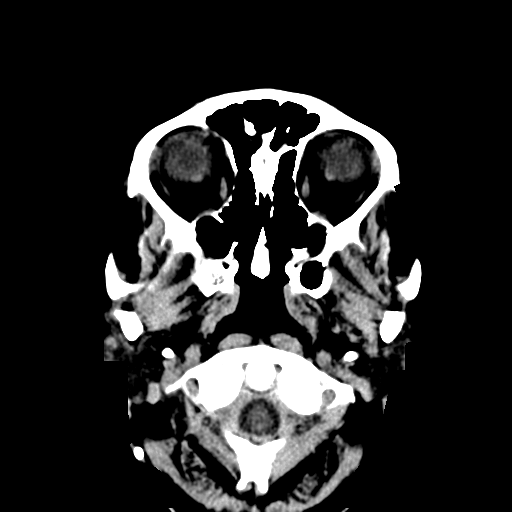
[im 3/33  bone]
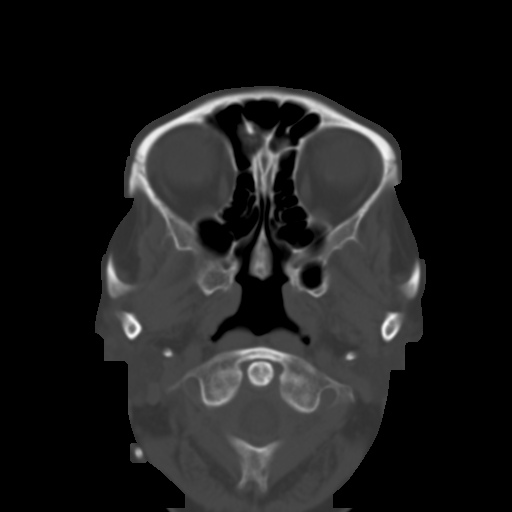
[im 6/33  brain]
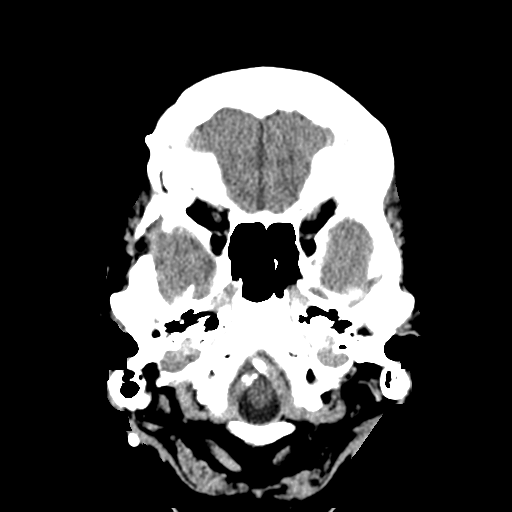
[im 9/33  brain]
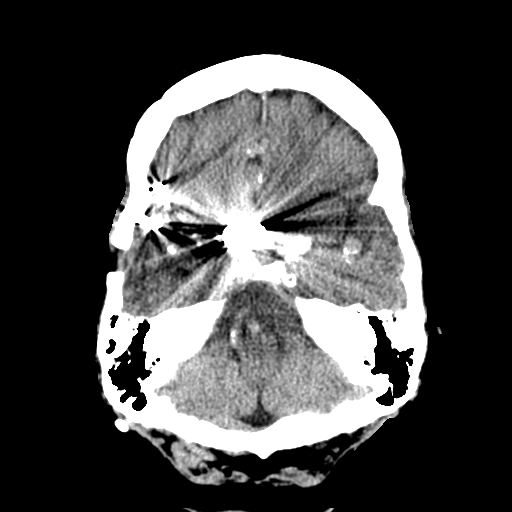
[im 13/33  brain]
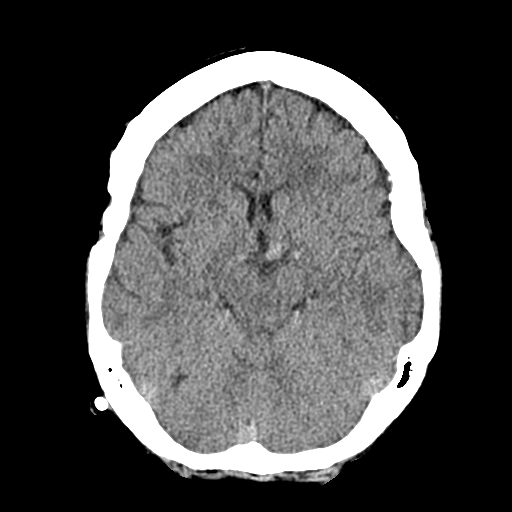
[im 17/33  brain]
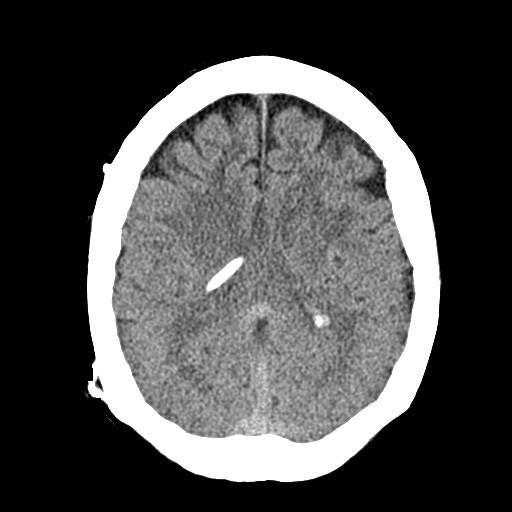
[im 17/33  bone]
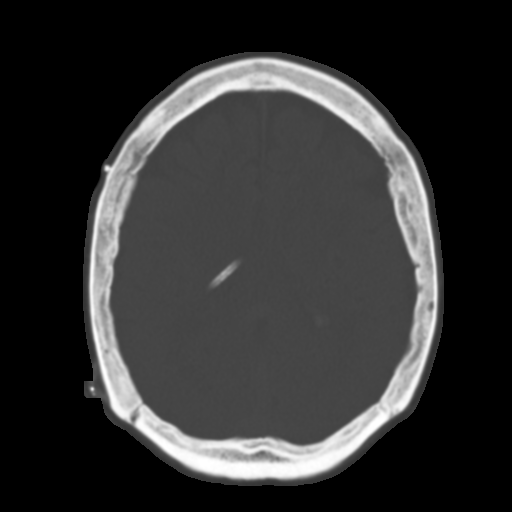
[im 20/33  brain]
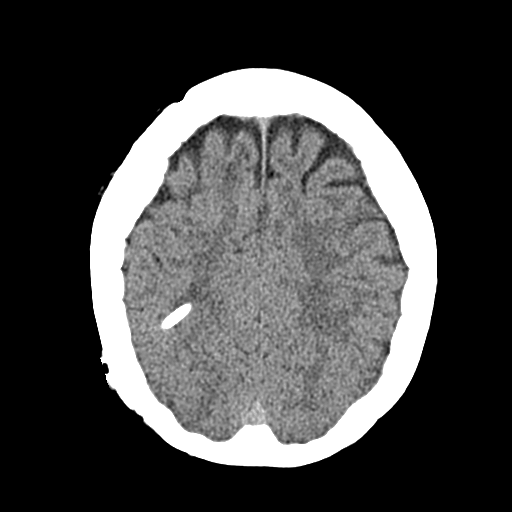
[im 24/33  brain]
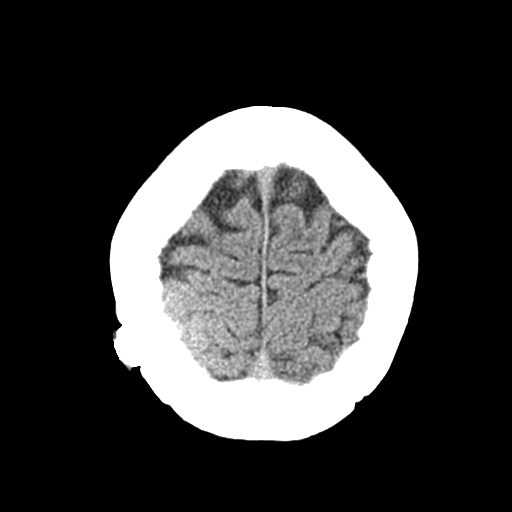
[im 27/33  brain]
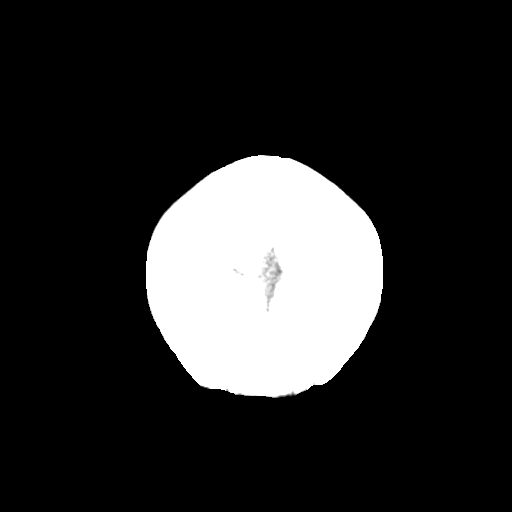
[im 30/33  brain]
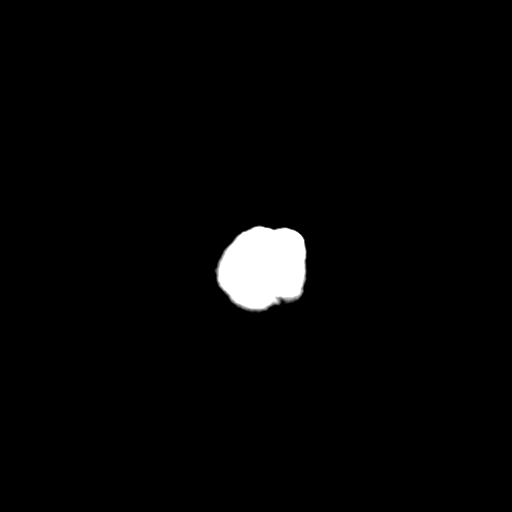
[im 30/33  bone]
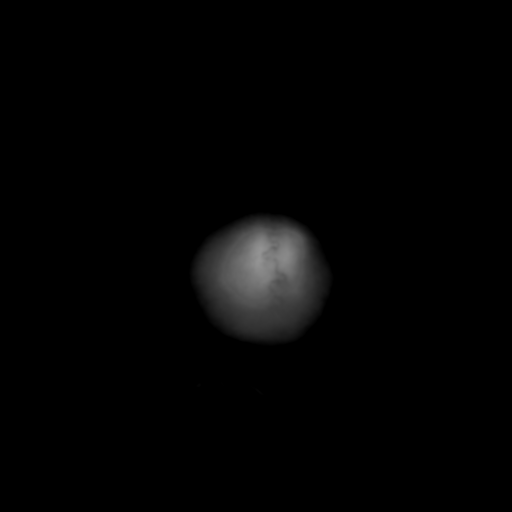

[Series 4: cor soft · coronal · 0.35mm/px · 3 of 66 slices shown]
[im 22/66  brain]
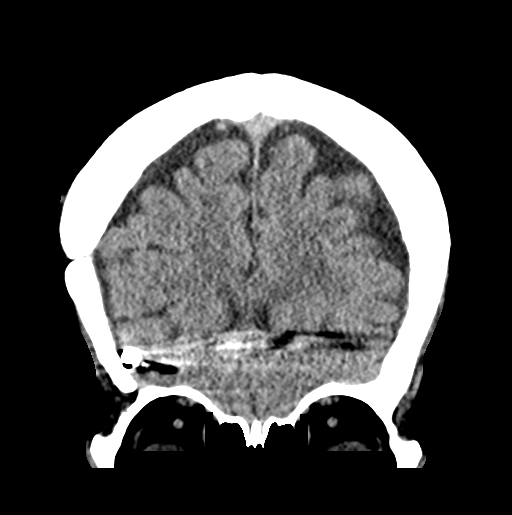
[im 29/66  brain]
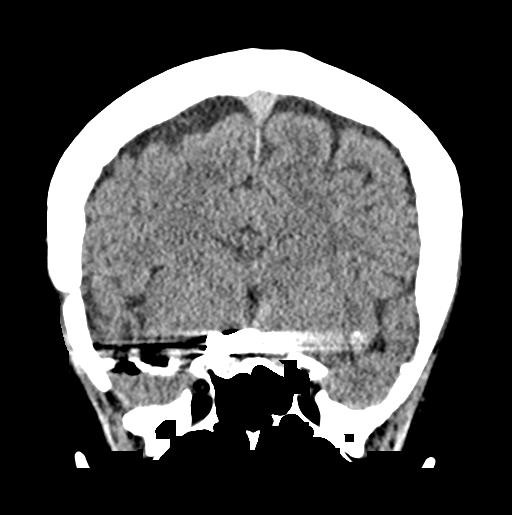
[im 37/66  brain]
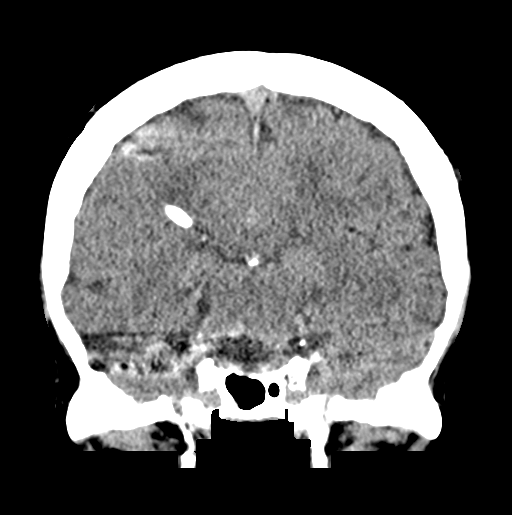

[Series 5: sag soft · sagittal · 0.33mm/px · 3 of 56 slices shown]
[im 19/56  brain]
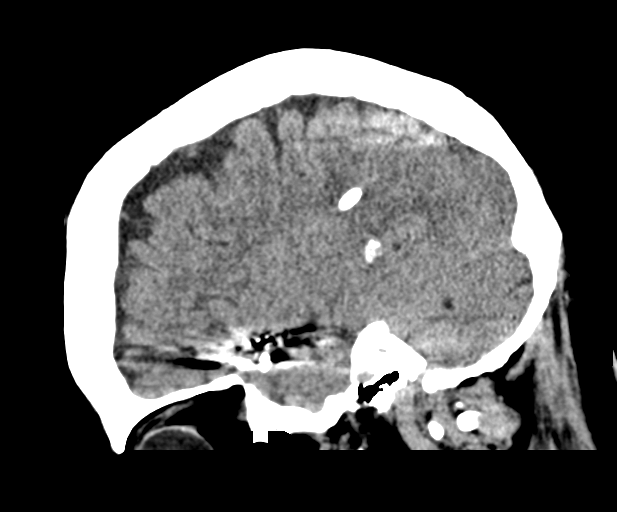
[im 28/56  brain]
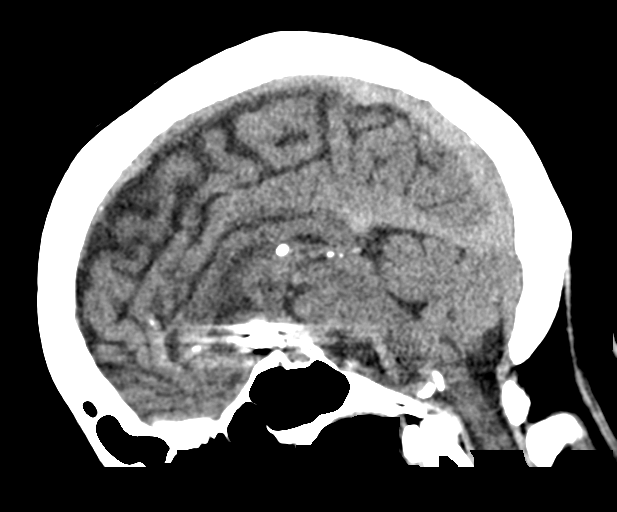
[im 37/56  brain]
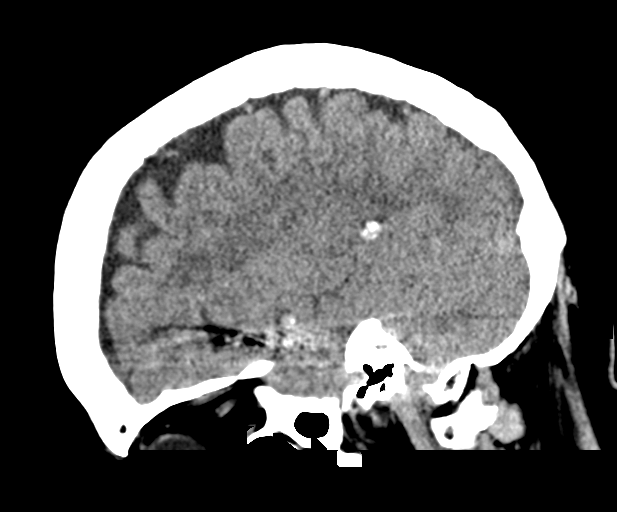

[15 of 47 positions shown; findings below may reference images not displayed]

FINDINGS: Brain:

Streak and beam hardening artifact arising from aneurysm clips in
the right paraclinoid region and along the course of the M2/M3 right
middle cerebral artery branches limits evaluation.

A right parietal approach ventricular catheter terminates just the
right of midline near the right foramen of [place]bb. No evidence of
hydrocephalus

Cerebral volume is normal.

Mild ill-defined hypoattenuation within the cerebral white matter is
nonspecific, but compatible with chronic small vessel ischemic
disease.

No evidence of acute intracranial hemorrhage or acute demarcated
cortical infarction. No extra-axial fluid collection. No evidence of
intracranial mass. No midline shift.

Vascular: Atherosclerotic calcifications. Vascular prominence in the
region of the distal M1 left middle cerebral artery measuring 6 mm
(series 2, image 9). This finding is suspicious for a left MCA
aneurysm.

Skull: Calvarial fracture.  Right-sided cranioplasty.

Sinuses/Orbits: Visualized orbits show no acute finding. No
significant paranasal sinus disease at the imaged levels.
IMPRESSION: Examination limited by streak and beam hardening artifact arising
from multiple aneurysm clips.

No evidence of acute intracranial hemorrhage or acute infarction.

Vascular prominence in the region of the distal M1 left middle
cerebral artery measuring 6 mm, highly suspicious for a left middle
cerebral artery aneurysm. CT angiography of the head is recommended
for further evaluation.

Right parietal approach ventricular catheter terminating just to the
right of midline near the foramen of [place]pigbb. No evidence of
hydrocephalus.

Mild cerebral white matter chronic small vessel ischemic disease.
